# Patient Record
Sex: Male | Born: 1945 | Race: White | Hispanic: No | Marital: Married | Smoking: Never smoker
Health system: Southern US, Community
[De-identification: ages and names within clinical notes are randomized; demographics above are authoritative.]

## PROBLEM LIST (undated history)

## (undated) DIAGNOSIS — E119 Type 2 diabetes mellitus without complications: Secondary | ICD-10-CM

## (undated) DIAGNOSIS — M1990 Unspecified osteoarthritis, unspecified site: Secondary | ICD-10-CM

## (undated) DIAGNOSIS — I10 Essential (primary) hypertension: Secondary | ICD-10-CM

## (undated) DIAGNOSIS — C4490 Unspecified malignant neoplasm of skin, unspecified: Secondary | ICD-10-CM

## (undated) DIAGNOSIS — R011 Cardiac murmur, unspecified: Secondary | ICD-10-CM

## (undated) DIAGNOSIS — E785 Hyperlipidemia, unspecified: Secondary | ICD-10-CM

## (undated) DIAGNOSIS — G4730 Sleep apnea, unspecified: Secondary | ICD-10-CM

## (undated) HISTORY — DX: Unspecified osteoarthritis, unspecified site: M19.90

## (undated) HISTORY — PX: CATARACT EXTRACTION: SUR2

## (undated) HISTORY — DX: Sleep apnea, unspecified: G47.30

## (undated) HISTORY — PX: CHOLECYSTECTOMY: SHX55

## (undated) HISTORY — PX: PARS PLANA VITRECTOMY W/ REPAIR OF MACULAR HOLE: SHX2170

## (undated) HISTORY — DX: Cardiac murmur, unspecified: R01.1

## (undated) HISTORY — DX: Essential (primary) hypertension: I10

## (undated) HISTORY — DX: Type 2 diabetes mellitus without complications: E11.9

## (undated) HISTORY — PX: APPENDECTOMY: SHX54

## (undated) HISTORY — DX: Unspecified malignant neoplasm of skin, unspecified: C44.90

## (undated) HISTORY — DX: Hyperlipidemia, unspecified: E78.5

---

## 2014-05-28 DIAGNOSIS — M160 Bilateral primary osteoarthritis of hip: Secondary | ICD-10-CM | POA: Insufficient documentation

## 2014-05-28 DIAGNOSIS — I10 Essential (primary) hypertension: Secondary | ICD-10-CM | POA: Insufficient documentation

## 2014-05-28 DIAGNOSIS — E7800 Pure hypercholesterolemia, unspecified: Secondary | ICD-10-CM | POA: Insufficient documentation

## 2014-05-28 DIAGNOSIS — G4733 Obstructive sleep apnea (adult) (pediatric): Secondary | ICD-10-CM | POA: Insufficient documentation

## 2014-05-30 DIAGNOSIS — N182 Chronic kidney disease, stage 2 (mild): Secondary | ICD-10-CM

## 2014-05-30 DIAGNOSIS — E1122 Type 2 diabetes mellitus with diabetic chronic kidney disease: Secondary | ICD-10-CM | POA: Insufficient documentation

## 2014-09-30 DIAGNOSIS — Z6841 Body Mass Index (BMI) 40.0 and over, adult: Secondary | ICD-10-CM | POA: Insufficient documentation

## 2015-10-01 DIAGNOSIS — Z0000 Encounter for general adult medical examination without abnormal findings: Secondary | ICD-10-CM | POA: Insufficient documentation

## 2016-10-12 ENCOUNTER — Other Ambulatory Visit: Payer: Self-pay

## 2016-11-09 ENCOUNTER — Encounter: Payer: Self-pay | Admitting: Urology

## 2016-11-09 ENCOUNTER — Ambulatory Visit (INDEPENDENT_AMBULATORY_CARE_PROVIDER_SITE_OTHER): Payer: Medicare Other | Admitting: Urology

## 2016-11-09 DIAGNOSIS — R972 Elevated prostate specific antigen [PSA]: Secondary | ICD-10-CM | POA: Diagnosis not present

## 2016-11-09 DIAGNOSIS — N400 Enlarged prostate without lower urinary tract symptoms: Secondary | ICD-10-CM

## 2016-11-09 NOTE — Progress Notes (Signed)
11/09/2016 11:14 AM   Alvin Thompson 1945-07-25 [id number]  Referring provider: Kirk Ruths, MD [place] [facility] [place], [place] [address]  Chief Complaint  Patient presents with  . Elevated PSA    HPI: The patient presents today for evaluation of an elevated PSA.  1. Elevated PSA PSA was 4.02 in June 2018. Denies previous history of elevated PSAs. No family history of prostate cancer.  2. BPH IPSS: 9/3. Nocturia x2. On flomax. He does sometimes feel like his bladder is not completely empty. Overall he is not bothered by these symptoms.   PMH: Past Medical History:  Diagnosis Date  . Arthritis   . Diabetes mellitus without complication ([place])   . Heart murmur   . Hyperlipidemia   . Hypertension   . Skin cancer   . Sleep apnea     Surgical History: Past Surgical History:  Procedure Laterality Date  . APPENDECTOMY    . CATARACT EXTRACTION    . CHOLECYSTECTOMY    . PARS PLANA VITRECTOMY W/ REPAIR OF MACULAR HOLE      Home Medications:  Allergies as of 11/09/2016   No Known Allergies     Medication List       Accurate as of 11/09/16 11:14 AM. Always use your most recent med list.          amLODipine 10 MG tablet Commonly known as:  NORVASC TAKE ONE TABLET BY MOUTH ONCE DAILY   aspirin EC 81 MG tablet Take by mouth.   HUMALOG KWIKPEN 100 UNIT/ML KiwkPen Generic drug:  insulin lispro INJECT 15 UNITS SUBCUTANEOUSLY THREE TIMES DAILY WITH MEALS   LANTUS SOLOSTAR 100 UNIT/ML Solostar Pen Generic drug:  Insulin Glargine INJECT 50 UNITS SUBCUTANEOUSLY ONCE DAILY   lisinopril-hydrochlorothiazide 20-12.5 MG tablet Commonly known as:  PRINZIDE,ZESTORETIC Take by mouth.   metFORMIN 1000 MG tablet Commonly known as:  GLUCOPHAGE TAKE ONE TABLET BY MOUTH TWICE DAILY WITH MEALS   rosuvastatin 20 MG tablet Commonly known as:  CRESTOR Take by mouth.   tamsulosin 0.4 MG Caps capsule Commonly known as:   FLOMAX Take by mouth.       Allergies: No Known Allergies  Family History: Family History  Problem Relation Age of Onset  . Prostate cancer Neg Hx   . Bladder Cancer Neg Hx   . Kidney cancer Neg Hx     Social History:  reports that he has never smoked. He has never used smokeless tobacco. He reports that he does not drink alcohol or use drugs.  ROS: UROLOGY Frequent Urination?: Yes Hard to postpone urination?: Yes Burning/pain with urination?: No Get up at night to urinate?: Yes Leakage of urine?: Yes Urine stream starts and stops?: Yes Trouble starting stream?: No Do you have to strain to urinate?: No Blood in urine?: No Urinary tract infection?: No Sexually transmitted disease?: No Injury to kidneys or bladder?: No Painful intercourse?: No Weak stream?: No Erection problems?: Yes Penile pain?: No  Gastrointestinal Nausea?: No Vomiting?: No Indigestion/heartburn?: No Diarrhea?: Yes Constipation?: No  Constitutional Fever: No Night sweats?: No Weight loss?: No Fatigue?: No  Skin Skin rash/lesions?: No Itching?: Yes  Eyes Blurred vision?: No Double vision?: No  Ears/Nose/Throat Sore throat?: No Sinus problems?: Yes  Hematologic/Lymphatic Swollen glands?: No Easy bruising?: Yes  Cardiovascular Leg swelling?: No Chest pain?: No  Respiratory Cough?: No Shortness of breath?: No  Endocrine Excessive thirst?: No  Musculoskeletal Back pain?: Yes Joint pain?: Yes  Neurological Headaches?:  No Dizziness?: No  Psychologic Depression?: No Anxiety?: No  Physical Exam: There were no vitals taken for this visit.  Constitutional:  Alert and oriented, No acute distress. HEENT: [place] AT, moist mucus membranes.  Trachea midline, no masses. Cardiovascular: No clubbing, cyanosis, or edema. Respiratory: Normal respiratory effort, no increased work of breathing. GI: Abdomen is soft, nontender, nondistended, no abdominal masses GU: No CVA  tenderness. Normal phallus. Testicles descended equally bilaterally benign. DRE: Prostate difficult to palpate due to body habitus. No obvious abnormalities on the palpable portion of distal prostate. Skin: No rashes, bruises or suspicious lesions. Lymph: No cervical or inguinal adenopathy. Neurologic: Grossly intact, no focal deficits, moving all 4 extremities. Psychiatric: Normal mood and affect.  Laboratory Data: No results found for: WBC, HGB, HCT, MCV, PLT  No results found for: CREATININE  No results found for: PSA  No results found for: TESTOSTERONE  No results found for: HGBA1C  Urinalysis No results found for: COLORURINE, APPEARANCEUR, LABSPEC, PHURINE, GLUCOSEU, HGBUR, BILIRUBINUR, KETONESUR, PROTEINUR, UROBILINOGEN, NITRITE, LEUKOCYTESUR   Assessment & Plan:    1. Elevated PSA I had a long discussion with the patient regarding the role of PSA testing and prostate cancer screening. We also discussed the natural history of prostate cancer. We discussed that the next step is to confirm that this is a true elevation with a repeat PSA test. We also discussed with that we typically schedule prostate biopsy pending the results of this repeat PSA. He understands the risks, benefits, indications for prostate biopsy. He understands the risks include bleeding and infection. He understands that he will see blood in his urine, stool, and semen. The blood in his semen may persist for up to 6 weeks. He also understands the risk of infection of about 1% would require inpatient hospitalization for IV antibiotics. All questions were answered. The patient has elected to proceed with prostate biopsy pending the results of his repeat PSA.  2. BPH -continue flomax   No Follow-up on file.  Nickie Retort, MD  [facility] [address], [place], [place] [address] [phone]

## 2016-11-09 NOTE — Addendum Note (Signed)
Addended by: Tommy Rainwater on: 11/09/2016 11:30 AM   Modules accepted: Orders

## 2016-11-10 ENCOUNTER — Telehealth: Payer: Self-pay | Admitting: Family Medicine

## 2016-11-10 LAB — PSA: Prostate Specific Ag, Serum: 3.5 ng/mL (ref 0.0–4.0)

## 2016-11-10 NOTE — Telephone Encounter (Signed)
Left message on machine for patient to call back.

## 2016-11-10 NOTE — Telephone Encounter (Signed)
-----   Message from Nickie Retort, MD sent at 11/10/2016 11:16 AM EDT ----- Please let patient know PSA is back in normal range. Can cancel biopsy. He should f/u in 6 months with psa prior instead  ----- Message ----- From: Orlene Erm, CMA Sent: 11/10/2016   8:02 AM To: Nickie Retort, MD    ----- Message ----- From: Interface, [facility] Lab Results In Sent: 11/10/2016   5:42 AM To: [facility]

## 2016-11-11 NOTE — Telephone Encounter (Signed)
LMOM for patient to call office back. 

## 2016-11-14 NOTE — Telephone Encounter (Signed)
Pt called and I read note from Dr Pilar Jarvis.  I canceled biopsy and results.  I made 6 month follow up with PSA prior.

## 2016-12-01 ENCOUNTER — Other Ambulatory Visit: Payer: Medicare Other

## 2016-12-08 ENCOUNTER — Ambulatory Visit: Payer: Medicare Other

## 2017-05-03 ENCOUNTER — Other Ambulatory Visit: Payer: Self-pay

## 2017-05-03 DIAGNOSIS — R972 Elevated prostate specific antigen [PSA]: Secondary | ICD-10-CM

## 2017-05-04 ENCOUNTER — Other Ambulatory Visit: Payer: Medicare Other

## 2017-05-04 DIAGNOSIS — R972 Elevated prostate specific antigen [PSA]: Secondary | ICD-10-CM

## 2017-05-05 LAB — PSA: Prostate Specific Ag, Serum: 3.8 ng/mL (ref 0.0–4.0)

## 2017-05-11 ENCOUNTER — Ambulatory Visit: Payer: Medicare Other | Admitting: Urology

## 2017-05-11 ENCOUNTER — Encounter: Payer: Self-pay | Admitting: Urology

## 2017-05-11 VITALS — BP 179/70 | HR 64 | Ht 71.0 in | Wt 289.3 lb

## 2017-05-11 DIAGNOSIS — N400 Enlarged prostate without lower urinary tract symptoms: Secondary | ICD-10-CM | POA: Diagnosis not present

## 2017-05-11 DIAGNOSIS — R972 Elevated prostate specific antigen [PSA]: Secondary | ICD-10-CM

## 2017-05-11 NOTE — Progress Notes (Signed)
05/11/2017 9:20 AM   Alvin Thompson 02-22-1946 [id number]  Referring provider: Kirk Ruths, MD [facility] [facility] [place], [place] [address]  No chief complaint on file.   HPI: The patient presents today for follow up of an elevated PSA.  1. Elevated PSA PSA was 4.02 in June 2018. Denies previous history of elevated PSAs. No family history of prostate cancer.  DRE in  July 2018 was difficult due to to body habitus.  The palpable portion of the distal prostate was benign.  However, repeat PSA in July 2018 was 3.5.  PSA was 3.8 in January 2019.  2. BPH IPSS: 9/3. Nocturia x2. On flomax. He does sometimes feel like his bladder is not completely empty. Overall he is not bothered by these symptoms.        PMH: Past Medical History:  Diagnosis Date  . Arthritis   . Diabetes mellitus without complication ([place])   . Heart murmur   . Hyperlipidemia   . Hypertension   . Skin cancer   . Sleep apnea     Surgical History: Past Surgical History:  Procedure Laterality Date  . APPENDECTOMY    . CATARACT EXTRACTION    . CHOLECYSTECTOMY    . PARS PLANA VITRECTOMY W/ REPAIR OF MACULAR HOLE      Home Medications:  Allergies as of 05/11/2017   No Known Allergies     Medication List        Accurate as of 05/11/17  9:20 AM. Always use your most recent med list.          amLODipine 10 MG tablet Commonly known as:  NORVASC TAKE ONE TABLET BY MOUTH ONCE DAILY   aspirin EC 81 MG tablet Take by mouth.   HUMALOG KWIKPEN 100 UNIT/ML KiwkPen Generic drug:  insulin lispro INJECT 15 UNITS SUBCUTANEOUSLY THREE TIMES DAILY WITH MEALS   LANTUS SOLOSTAR 100 UNIT/ML Solostar Pen Generic drug:  Insulin Glargine INJECT 50 UNITS SUBCUTANEOUSLY ONCE DAILY   lisinopril-hydrochlorothiazide 20-12.5 MG tablet Commonly known as:  PRINZIDE,ZESTORETIC Take by mouth.   metFORMIN 1000 MG tablet Commonly known as:  GLUCOPHAGE TAKE ONE TABLET BY  MOUTH TWICE DAILY WITH MEALS   rosuvastatin 20 MG tablet Commonly known as:  CRESTOR Take by mouth.   tamsulosin 0.4 MG Caps capsule Commonly known as:  FLOMAX Take by mouth.       Allergies: No Known Allergies  Family History: Family History  Problem Relation Age of Onset  . Prostate cancer Neg Hx   . Bladder Cancer Neg Hx   . Kidney cancer Neg Hx     Social History:  reports that  has never smoked. he has never used smokeless tobacco. He reports that he does not drink alcohol or use drugs.  ROS: UROLOGY Frequent Urination?: No Hard to postpone urination?: No Burning/pain with urination?: No Get up at night to urinate?: Yes Leakage of urine?: No Urine stream starts and stops?: No Trouble starting stream?: No Do you have to strain to urinate?: No Blood in urine?: No Urinary tract infection?: No Sexually transmitted disease?: No Injury to kidneys or bladder?: No Painful intercourse?: No Weak stream?: No Erection problems?: Yes Penile pain?: No  Gastrointestinal Nausea?: No Vomiting?: No Indigestion/heartburn?: No Diarrhea?: No Constipation?: No  Constitutional Fever: No Night sweats?: No Weight loss?: No Fatigue?: No  Skin Skin rash/lesions?: No Itching?: No  Eyes Blurred vision?: No Double vision?: No  Ears/Nose/Throat Sore throat?: No Sinus problems?:  No  Hematologic/Lymphatic Swollen glands?: No Easy bruising?: No  Cardiovascular Leg swelling?: No Chest pain?: No  Respiratory Cough?: No Shortness of breath?: No  Endocrine Excessive thirst?: No  Musculoskeletal Back pain?: No Joint pain?: No  Neurological Headaches?: No Dizziness?: No  Psychologic Depression?: No Anxiety?: No  Physical Exam: BP (!) 179/70 (BP Location: Right Arm, Patient Position: Sitting, Cuff Size: Normal)   Pulse 64   Ht 5\' 11"  (1.803 m)   Wt 289 lb 4.8 oz (131.2 kg)   BMI 40.35 kg/m   Constitutional:  Alert and oriented, No acute  distress. HEENT: [place] AT, moist mucus membranes.  Trachea midline, no masses. Cardiovascular: No clubbing, cyanosis, or edema. Respiratory: Normal respiratory effort, no increased work of breathing. GI: Abdomen is soft, nontender, nondistended, no abdominal masses GU: No CVA tenderness.  Skin: No rashes, bruises or suspicious lesions. Lymph: No cervical or inguinal adenopathy. Neurologic: Grossly intact, no focal deficits, moving all 4 extremities. Psychiatric: Normal mood and affect.  Laboratory Data: No results found for: WBC, HGB, HCT, MCV, PLT  No results found for: CREATININE  No results found for: PSA  No results found for: TESTOSTERONE  No results found for: HGBA1C  Urinalysis No results found for: COLORURINE, APPEARANCEUR, LABSPEC, PHURINE, GLUCOSEU, HGBUR, BILIRUBINUR, KETONESUR, PROTEINUR, UROBILINOGEN, NITRITE, LEUKOCYTESUR   Assessment & Plan:    1.  History of elevated PSA PSA x2 now in normal range with history only being a very mildly elevated PSA.  He can now return to annual screening.  Follow-up in 1 year with PSA prior.  2.  BPH Continue Flomax  No Follow-up on file.  Nickie Retort, MD  [facility] [address] [place], [place] [address] [phone]

## 2017-11-17 ENCOUNTER — Ambulatory Visit: Payer: Medicare Other | Admitting: Urology

## 2017-11-17 ENCOUNTER — Encounter: Payer: Self-pay | Admitting: Urology

## 2017-11-17 VITALS — BP 137/69 | HR 70 | Ht 70.0 in | Wt 270.0 lb

## 2017-11-17 DIAGNOSIS — Z87440 Personal history of urinary (tract) infections: Secondary | ICD-10-CM

## 2017-11-17 DIAGNOSIS — R972 Elevated prostate specific antigen [PSA]: Secondary | ICD-10-CM | POA: Diagnosis not present

## 2017-11-17 DIAGNOSIS — N400 Enlarged prostate without lower urinary tract symptoms: Secondary | ICD-10-CM

## 2017-11-17 LAB — MICROSCOPIC EXAMINATION: Epithelial Cells (non renal): NONE SEEN /hpf (ref 0–10)

## 2017-11-17 LAB — URINALYSIS, COMPLETE
BILIRUBIN UA: NEGATIVE
KETONES UA: NEGATIVE
Nitrite, UA: NEGATIVE
PH UA: 5.5 (ref 5.0–7.5)
Specific Gravity, UA: >1.03 — ABNORMAL HIGH (ref 1.005–1.030)
UUROB: 0.2 mg/dL (ref 0.2–1.0)

## 2017-11-17 NOTE — Progress Notes (Signed)
11/17/2017 1:11 PM   Alvin Thompson [birth year] [id number]  Referring provider: Kirk Ruths, MD [facility] [place],  [id number]  Chief Complaint  Patient presents with  . Elevated PSA    HPI: 72 year old male with history of elevated PSA and BPH who presents today for follow-up of his issues.  Since last visit, he has actively tring to lose weight and improved control of his diabetes.    He was seen and evaluated in 10/2016 by Dr. Baruch Gouty for elevated PSA.  The point in time, his PSA was 4.02 on 09/2016.  His PSA has been fluctuating somewhat since then.  PSA trend as below.  He also has a history of BPH on Flomax.  IPSS 6/1.  He is going well.  Nocturia x 2  He was treated for a UTI 06/2017.  This was his first recent UTI and was symptomatic at the time.  He denies and dysuria or gross hematuria today.    PSA: 4.02 09/2016 3.5 10/2016 3.8 05/2017 4.34 09/2017  IPSS    Row Name 11/17/17 1100         International Prostate Symptom Score   How often have you had the sensation of not emptying your bladder?  Less than 1 in 5     How often have you had to urinate less than every two hours?  Less than 1 in 5 times     How often have you found you stopped and started again several times when you urinated?  Less than 1 in 5 times     How often have you found it difficult to postpone urination?  Less than 1 in 5 times     How often have you had a weak urinary stream?  Not at All     How often have you had to strain to start urination?  Not at All     How many times did you typically get up at night to urinate?  2 Times     Total IPSS Score  6       Quality of Life due to urinary symptoms   If you were to spend the rest of your life with your urinary condition just the way it is now how would you feel about that?  Pleased        Score:  1-7 Mild 8-19 Moderate 20-35 Severe   PMH: Past Medical History:  Diagnosis Date    . Arthritis   . Diabetes mellitus without complication ([place])   . Heart murmur   . Hyperlipidemia   . Hypertension   . Skin cancer   . Sleep apnea     Surgical History: Past Surgical History:  Procedure Laterality Date  . APPENDECTOMY    . CATARACT EXTRACTION    . CHOLECYSTECTOMY    . PARS PLANA VITRECTOMY W/ REPAIR OF MACULAR HOLE      Home Medications:  Allergies as of 11/17/2017   No Known Allergies     Medication List        Accurate as of 11/17/17 11:59 PM. Always use your most recent med list.          amLODipine 10 MG tablet Commonly known as:  NORVASC TAKE ONE TABLET BY MOUTH ONCE DAILY   aspirin EC 81 MG tablet Take by mouth.   HUMALOG KWIKPEN 100 UNIT/ML KiwkPen Generic drug:  insulin lispro INJECT 15 UNITS SUBCUTANEOUSLY THREE TIMES DAILY WITH MEALS  LANTUS SOLOSTAR 100 UNIT/ML Solostar Pen Generic drug:  Insulin Glargine INJECT 50 UNITS SUBCUTANEOUSLY ONCE DAILY   lisinopril 40 MG tablet Commonly known as:  PRINIVIL,ZESTRIL Take 40 mg by mouth daily.   lisinopril-hydrochlorothiazide 20-12.5 MG tablet Commonly known as:  PRINZIDE,ZESTORETIC Take by mouth.   metFORMIN 1000 MG tablet Commonly known as:  GLUCOPHAGE TAKE ONE TABLET BY MOUTH TWICE DAILY WITH MEALS   rosuvastatin 20 MG tablet Commonly known as:  CRESTOR Take by mouth.   tamsulosin 0.4 MG Caps capsule Commonly known as:  FLOMAX Take by mouth.       Allergies: No Known Allergies  Family History: Family History  Problem Relation Age of Onset  . Prostate cancer Neg Hx   . Bladder Cancer Neg Hx   . Kidney cancer Neg Hx     Social History:  reports that he has never smoked. He has never used smokeless tobacco. He reports that he does not drink alcohol or use drugs.  ROS: UROLOGY Frequent Urination?: No Hard to postpone urination?: Yes Burning/pain with urination?: No Get up at night to urinate?: Yes Leakage of urine?: No Urine stream starts and stops?: No Trouble  starting stream?: No Do you have to strain to urinate?: No Blood in urine?: No Urinary tract infection?: No Sexually transmitted disease?: No Injury to kidneys or bladder?: No Painful intercourse?: No Weak stream?: No Erection problems?: Yes Penile pain?: No  Gastrointestinal Nausea?: No Vomiting?: No Indigestion/heartburn?: No Diarrhea?: No Constipation?: No  Constitutional Fever: No Night sweats?: No Weight loss?: No Fatigue?: No  Skin Skin rash/lesions?: No Itching?: No  Eyes Blurred vision?: No Double vision?: No  Ears/Nose/Throat Sore throat?: No Sinus problems?: No  Hematologic/Lymphatic Swollen glands?: No Easy bruising?: Yes  Cardiovascular Leg swelling?: No Chest pain?: No  Respiratory Cough?: No Shortness of breath?: No  Endocrine Excessive thirst?: No  Musculoskeletal Back pain?: No Joint pain?: No  Neurological Headaches?: No Dizziness?: No  Psychologic Depression?: No Anxiety?: No  Physical Exam: BP 137/69   Pulse 70   Ht 5\' 10"  (1.778 m)   Wt 270 lb (122.5 kg)   BMI 38.74 kg/m   Constitutional:  Alert and oriented, No acute distress. HEENT: [place] AT, moist mucus membranes.  Trachea midline, no masses. Cardiovascular: No clubbing, cyanosis, or edema. Respiratory: Normal respiratory effort, no increased work of breathing. GI: Abdomen is soft, nontender, nondistended, no abdominal masses, obese Rectal: Normal sphincter tone.  40+ cc prostate, nontender, no nodules, exam somewhat limited due to habitus, able to palpate apex and mid gland but not base. Skin: No rashes, bruises or suspicious lesions. Neurologic: Grossly intact, no focal deficits, moving all 4 extremities. Psychiatric: Normal mood and affect.  Laboratory Data: PSA trend as above  Urinalysis Results for orders placed or performed in visit on 11/17/17  Microscopic Examination  Result Value Ref Range   WBC, UA 0-5 0 - 5 /hpf   RBC, UA 0-2 0 - 2 /hpf    Epithelial Cells (non renal) None seen 0 - 10 /hpf   Casts Present (A) None seen /lpf   Cast Type Hyaline casts N/A   Mucus, UA Present (A) Not Estab.   Bacteria, UA Moderate (A) None seen/Few  Urinalysis, Complete  Result Value Ref Range   Specific Gravity, UA >1.030 (H) 1.005 - 1.030   pH, UA 5.5 5.0 - 7.5   Color, UA Yellow Yellow   Appearance Ur Clear Clear   Leukocytes, UA Trace (A) Negative   Protein, UA  3+ (A) Negative/Trace   Glucose, UA Trace (A) Negative   Ketones, UA Negative Negative   RBC, UA Trace (A) Negative   Bilirubin, UA Negative Negative   Urobilinogen, Ur 0.2 0.2 - 1.0 mg/dL   Nitrite, UA Negative Negative   Microscopic Examination See below:     Pertinent Imaging: N/a  Assessment & Plan:    1. Elevated PSA History of elevated/fluctuating PSA Most recent PSA within the range of his previous values Rectal exam today reassuring We will plan to continue to monitor relatively closely, repeat PSA in 6 months and then again in a year We discussed the variability between assays, using [facility] and then alternating with [facility] I would prefer him to have all of his PSAs done in one location variance and fluctuation He is agreeable this plan and have his PSA is drawn in our office - Urinalysis, Complete - PSA; Future  2. Benign prostatic hyperplasia without lower urinary tract symptoms Symptoms well controlled on Flomax Continue this medication Not interested in any surgical intervention  3. History of UTI Isolated urinary tract infection earlier this year UA today is not suspicious We discussed that it is unusual for men to have infections, at minimum as well as bladder scan to ensure that he is emptying his bladder He is agreeable and will let us know if he has any more UTIs or UTI symptoms    Return in about 1 year (around 11/18/2018), or MD 1 year IPSS/ PVR/ PSA/ DRE, lab only in 6 months for PSA.  Hollice Espy, MD  [facility] [address] [place], [place] [address] [phone]  I spent 25 min with this patient of which greater than 50% was spent in counseling and coordination of care with the patient.

## 2018-05-21 ENCOUNTER — Other Ambulatory Visit: Payer: Medicare Other

## 2018-05-21 DIAGNOSIS — R972 Elevated prostate specific antigen [PSA]: Secondary | ICD-10-CM

## 2018-05-22 LAB — PSA: Prostate Specific Ag, Serum: 4 ng/mL (ref 0.0–4.0)

## 2018-11-13 ENCOUNTER — Other Ambulatory Visit: Payer: Self-pay

## 2018-11-13 DIAGNOSIS — R972 Elevated prostate specific antigen [PSA]: Secondary | ICD-10-CM

## 2018-11-15 ENCOUNTER — Other Ambulatory Visit: Payer: Self-pay

## 2018-11-15 ENCOUNTER — Other Ambulatory Visit: Payer: Medicare Other

## 2018-11-15 DIAGNOSIS — R972 Elevated prostate specific antigen [PSA]: Secondary | ICD-10-CM

## 2018-11-16 LAB — PSA: Prostate Specific Ag, Serum: 4.8 ng/mL — ABNORMAL HIGH (ref 0.0–4.0)

## 2018-11-20 ENCOUNTER — Encounter: Payer: Self-pay | Admitting: Urology

## 2018-11-20 ENCOUNTER — Other Ambulatory Visit: Payer: Self-pay

## 2018-11-20 ENCOUNTER — Ambulatory Visit: Payer: Medicare Other | Admitting: Urology

## 2018-11-20 VITALS — BP 172/73 | HR 71 | Ht 70.0 in | Wt 285.6 lb

## 2018-11-20 DIAGNOSIS — N401 Enlarged prostate with lower urinary tract symptoms: Secondary | ICD-10-CM

## 2018-11-20 DIAGNOSIS — R972 Elevated prostate specific antigen [PSA]: Secondary | ICD-10-CM | POA: Diagnosis not present

## 2018-11-20 DIAGNOSIS — R351 Nocturia: Secondary | ICD-10-CM | POA: Diagnosis not present

## 2018-11-20 DIAGNOSIS — Z87440 Personal history of urinary (tract) infections: Secondary | ICD-10-CM

## 2018-11-20 LAB — BLADDER SCAN AMB NON-IMAGING: Scan Result: 0

## 2018-11-20 NOTE — Progress Notes (Signed)
11/20/2018 1:51 PM   Alvin Thompson [birth year] [id number]  Referring provider: Kirk Ruths, MD [facility] [name],  [place] [address]  Chief Complaint  Patient presents with  . Elevated PSA    HPI: 73 year old male with a personal history of elevated PSA/BPH who returns today for routine annual follow-up.  He was last seen 10/2017.  PSA trend as below.  He has been  for history of elevated PSA since 2018.  His most recent PSA was up to 4.8 on 10/2018.  He does have a history of BPH.  He continues on Flomax.  His IPSS is higher today than previously, 11/2, previously 6/1.  He reports that he is having some post void dribbling and gets a few times at night to void.    He does feel that he empties his bladder for the most part.   He is reasonably satisfied with his urinary symptoms.  No dysuria or gross hematuria.  No UTIs over the past year.  Remote isolated UTI greater than a year ago.  PSA: 4.02 09/2016 3.5 10/2016 3.8 05/2017 4.34 09/2017 4.8 on 10/2018   IPSS    Row Name 11/20/18 1300         International Prostate Symptom Score   How often have you had the sensation of not emptying your bladder?  Not at All     How often have you had to urinate less than every two hours?  Less than half the time     How often have you found you stopped and started again several times when you urinated?  More than half the time     How often have you found it difficult to postpone urination?  Less than half the time     How often have you had a weak urinary stream?  Not at All     How often have you had to strain to start urination?  Not at All     How many times did you typically get up at night to urinate?  3 Times     Total IPSS Score  11       Quality of Life due to urinary symptoms   If you were to spend the rest of your life with your urinary condition just the way it is now how would you feel about that?  Mostly Satisfied         Score:  1-7 Mild 8-19 Moderate 20-35 Severe   PMH: Past Medical History:  Diagnosis Date  . Arthritis   . Diabetes mellitus without complication ([place])   . Heart murmur   . Hyperlipidemia   . Hypertension   . Skin cancer   . Sleep apnea     Surgical History: Past Surgical History:  Procedure Laterality Date  . APPENDECTOMY    . CATARACT EXTRACTION    . CHOLECYSTECTOMY    . PARS PLANA VITRECTOMY W/ REPAIR OF MACULAR HOLE      Home Medications:  Allergies as of 11/20/2018   No Known Allergies     Medication List       Accurate as of November 20, 2018  1:51 PM. If you have any questions, ask your nurse or doctor.        amLODipine 10 MG tablet Commonly known as: NORVASC TAKE ONE TABLET BY MOUTH ONCE DAILY   aspirin EC 81 MG tablet Take by mouth.   HumaLOG KwikPen 100 UNIT/ML KiwkPen Generic  drug: insulin lispro INJECT 15 UNITS SUBCUTANEOUSLY THREE TIMES DAILY WITH MEALS   Lantus SoloStar 100 UNIT/ML Solostar Pen Generic drug: Insulin Glargine INJECT 50 UNITS SUBCUTANEOUSLY ONCE DAILY   lisinopril 40 MG tablet Commonly known as: ZESTRIL Take 40 mg by mouth daily.   lisinopril-hydrochlorothiazide 20-12.5 MG tablet Commonly known as: ZESTORETIC Take by mouth.   metFORMIN 1000 MG tablet Commonly known as: GLUCOPHAGE TAKE ONE TABLET BY MOUTH TWICE DAILY WITH MEALS   rosuvastatin 20 MG tablet Commonly known as: CRESTOR Take by mouth.   tamsulosin 0.4 MG Caps capsule Commonly known as: FLOMAX Take by mouth.       Allergies: No Known Allergies  Family History: Family History  Problem Relation Age of Onset  . Prostate cancer Neg Hx   . Bladder Cancer Neg Hx   . Kidney cancer Neg Hx     Social History:  reports that he has never smoked. He has never used smokeless tobacco. He reports that he does not drink alcohol or use drugs.  ROS: UROLOGY Frequent Urination?: No Hard to postpone urination?: Yes Burning/pain with urination?: No Get up at  night to urinate?: Yes Leakage of urine?: Yes Urine stream starts and stops?: No Trouble starting stream?: No Do you have to strain to urinate?: No Blood in urine?: No Urinary tract infection?: No Sexually transmitted disease?: No Injury to kidneys or bladder?: No Painful intercourse?: No Weak stream?: No Erection problems?: Yes Penile pain?: No  Gastrointestinal Nausea?: No Vomiting?: No Indigestion/heartburn?: No Diarrhea?: No Constipation?: No  Constitutional Fever: No Night sweats?: No Weight loss?: No Fatigue?: No  Skin Skin rash/lesions?: No Itching?: No  Eyes Blurred vision?: No Double vision?: No  Ears/Nose/Throat Sore throat?: No Sinus problems?: No  Hematologic/Lymphatic Swollen glands?: No Easy bruising?: Yes  Cardiovascular Leg swelling?: No Chest pain?: No  Respiratory Cough?: No Shortness of breath?: No  Endocrine Excessive thirst?: No  Musculoskeletal Back pain?: No Joint pain?: Yes  Neurological Headaches?: No Dizziness?: No  Psychologic Depression?: No Anxiety?: No  Physical Exam: BP (!) 172/73 (BP Location: Left Arm, Patient Position: Sitting, Cuff Size: Normal)   Pulse 71   Ht 5\' 10"  (1.778 m)   Wt 285 lb 9.6 oz (129.5 kg)   BMI 40.98 kg/m   Constitutional:  Alert and oriented, No acute distress. HEENT: [place] AT, moist mucus membranes.  Trachea midline, no masses. Cardiovascular: No clubbing, cyanosis, or edema. Respiratory: Normal respiratory effort, no increased work of breathing. GI: Abdomen is soft, nontender, nondistended, obese Rectal: Normal sphincter tone.  Exam limited somewhat by habitus, able to palpate the apex which is unremarkable.  Internal hemorrhoids palpable. Skin: No rashes, bruises or suspicious lesions. Neurologic: Grossly intact, no focal deficits, moving all 4 extremities. Psychiatric: Normal mood and affect.  Laboratory Data: PSA trend as above   Pertinent Imaging: Results for orders placed  or performed in visit on 11/20/18  Bladder Scan (Post Void Residual) in office  Result Value Ref Range   Scan Result 0     Assessment & Plan:    1. Benign prostatic hyperplasia with nocturia Adequate bladder emptying today Continue Flomax Discussed alternatives including the addition of finasteride versus consideration of outlet procedure, not interested in either of these interventions - Bladder Scan (Post Void Residual) in office  2. Elevated PSA History of elevated/rising PSA albeit slow rise with some fluctuation Given his age and comorbidities, continued surveillance is quite reasonable especially given that his PSA is only risen 0.5 over the past  year Alternatives including prostate MRI and prostate biopsy were also discussed along with risks and benefits of each He would prefer to continue annual surveillance which is reasonable Follow-up next year with PSA/rectal exam  3. History of UTI No UTIs this year We will continue to monitor  Return in about 1 year (around 11/20/2019) for PSA/ DRE, IPSS/ PVR/ UA.  Hollice Espy, MD  [facility] [address] [place],  [address] [phone]

## 2019-06-16 ENCOUNTER — Ambulatory Visit: Payer: Medicare Other | Attending: Internal Medicine

## 2019-06-16 ENCOUNTER — Ambulatory Visit: Payer: Self-pay

## 2019-06-16 DIAGNOSIS — Z23 Encounter for immunization: Secondary | ICD-10-CM | POA: Insufficient documentation

## 2019-06-16 NOTE — Progress Notes (Signed)
   Covid-19 Vaccination Clinic  Name:  Alvin Thompson    MRN: [place]:[id number] DOB: [birth year]  06/16/2019  Alvin Thompson was observed post Covid-19 immunization for 15 minutes without incidence. He was provided with Vaccine Information Sheet and instruction to access the V-Safe system.   Alvin Thompson was instructed to call 911 with any severe reactions post vaccine: Marland Kitchen Difficulty breathing  . Swelling of your face and throat  . A fast heartbeat  . A bad rash all over your body  . Dizziness and weakness    Immunizations Administered    Name Date Dose VIS Date Route   Pfizer COVID-19 Vaccine 06/16/2019  8:24 AM 0.3 mL 04/12/2019 Intramuscular   Manufacturer: [place]   Lot: [id number]   [place]: [id number]

## 2019-06-28 ENCOUNTER — Other Ambulatory Visit: Payer: Self-pay | Admitting: Internal Medicine

## 2019-06-28 DIAGNOSIS — Z794 Long term (current) use of insulin: Secondary | ICD-10-CM

## 2019-07-05 ENCOUNTER — Encounter: Payer: Self-pay | Admitting: Physician Assistant

## 2019-07-05 ENCOUNTER — Other Ambulatory Visit: Payer: Self-pay

## 2019-07-05 ENCOUNTER — Ambulatory Visit: Payer: Medicare Other | Admitting: Physician Assistant

## 2019-07-05 VITALS — BP 158/69 | HR 75 | Ht 70.0 in | Wt 294.0 lb

## 2019-07-05 DIAGNOSIS — R7989 Other specified abnormal findings of blood chemistry: Secondary | ICD-10-CM | POA: Diagnosis not present

## 2019-07-05 DIAGNOSIS — R3121 Asymptomatic microscopic hematuria: Secondary | ICD-10-CM

## 2019-07-05 LAB — URINALYSIS, COMPLETE
Bilirubin, UA: NEGATIVE
Ketones, UA: NEGATIVE
Leukocytes,UA: NEGATIVE
Nitrite, UA: NEGATIVE
Specific Gravity, UA: >1.03 — ABNORMAL HIGH (ref 1.005–1.030)
Urobilinogen, Ur: 0.2 mg/dL (ref 0.2–1.0)
pH, UA: 5.5 (ref 5.0–7.5)

## 2019-07-05 LAB — MICROSCOPIC EXAMINATION: Bacteria, UA: NONE SEEN

## 2019-07-05 LAB — BLADDER SCAN AMB NON-IMAGING: Scan Result: 18

## 2019-07-05 NOTE — Progress Notes (Signed)
07/05/2019 9:11 AM   Raynaldo Urban Gibson 03/03/1946 [id number]  CC: Microscopic hematuria  HPI: Alvin Thompson is a 74 y.o. male who presents today for evaluation of microscopic hematuria.  He is an established [facility] patient who most recently saw Dr. Erlene Quan on 11/20/2018 for routine annual follow-up of elevated PSA and BPH on Flomax.  He saw his PCP, Dr. Ouida Sills, last week for routine follow-up. He was noted to have microscopic hematuria with 4-10 RBCs/hpf on urine microscopy. Recent labs also notable for increased creatinine of 1.7, with progressive upward trend since 2018 and recent baseline of 1.3. He was referred to our clinic for further evaluation.  No abdominal cross-sectional imaging available. He has never undergone cystoscopy.  He denies dysuria, urgency, frequency, and gross hematuria.  He does report an occasional twinge of discomfort on his left side over the past 5 to 6 months.  He reports a history of nephrolithiasis approximately 8 years ago.  He passed the stone on his own.  He is not on anticoagulation.  He denies a personal or family history of bladder or renal cancers.  He is a never smoker.  He denies history of exposure to industrial dye.  In-office UA today positive for trace glucose, 2+ blood, and 3+ protein; urine microscopy with granular casts. PVR 9m.  PMH: Past Medical History:  Diagnosis Date  . Arthritis   . Diabetes mellitus without complication ([place]   . Heart murmur   . Hyperlipidemia   . Hypertension   . Skin cancer   . Sleep apnea     Surgical History: Past Surgical History:  Procedure Laterality Date  . APPENDECTOMY    . CATARACT EXTRACTION    . CHOLECYSTECTOMY    . PARS PLANA VITRECTOMY W/ REPAIR OF MACULAR HOLE      Home Medications:  Allergies as of 07/05/2019   No Known Allergies     Medication List       Accurate as of July 05, 2019  9:11 AM. If you have any questions, ask your nurse or doctor.        amLODipine 10  MG tablet Commonly known as: NORVASC TAKE ONE TABLET BY MOUTH ONCE DAILY   aspirin EC 81 MG tablet Take by mouth.   HumaLOG KwikPen 100 UNIT/ML KiwkPen Generic drug: insulin lispro INJECT 15 UNITS SUBCUTANEOUSLY THREE TIMES DAILY WITH MEALS   Lantus SoloStar 100 UNIT/ML Solostar Pen Generic drug: insulin glargine INJECT 50 UNITS SUBCUTANEOUSLY ONCE DAILY   lisinopril 40 MG tablet Commonly known as: ZESTRIL Take 40 mg by mouth daily.   lisinopril-hydrochlorothiazide 20-12.5 MG tablet Commonly known as: ZESTORETIC Take by mouth.   metFORMIN 1000 MG tablet Commonly known as: GLUCOPHAGE TAKE ONE TABLET BY MOUTH TWICE DAILY WITH MEALS   rosuvastatin 20 MG tablet Commonly known as: CRESTOR Take by mouth.   tamsulosin 0.4 MG Caps capsule Commonly known as: FLOMAX Take by mouth.      Allergies:  No Known Allergies  Family History: Family History  Problem Relation Age of Onset  . Prostate cancer Neg Hx   . Bladder Cancer Neg Hx   . Kidney cancer Neg Hx     Social History:   reports that he has never smoked. He has never used smokeless tobacco. He reports that he does not drink alcohol or use drugs.  Physical Exam: There were no vitals taken for this visit.  Constitutional:  Alert and oriented, no acute distress, nontoxic appearing HEENT: [place], AT Cardiovascular: No  clubbing, cyanosis, or edema Respiratory: Normal respiratory effort, no increased work of breathing Skin: No rashes, bruises or suspicious lesions Neurologic: Grossly intact, no focal deficits, moving all 4 extremities Psychiatric: Normal mood and affect  Laboratory Data: Results for orders placed or performed in visit on 07/05/19  Microscopic Examination   URINE  Result Value Ref Range   WBC, UA 0-5 0 - 5 /hpf   RBC 0-2 0 - 2 /hpf   Epithelial Cells (non renal) 0-10 0 - 10 /hpf   Casts Present (A) None seen /lpf   Bacteria, UA None seen None seen/Few  Urinalysis, Complete  Result Value Ref  Range   Specific Gravity, UA >1.030 (H) 1.005 - 1.030   pH, UA 5.5 5.0 - 7.5   Color, UA Yellow Yellow   Appearance Ur Clear Clear   Leukocytes,UA Negative Negative   Protein,UA 3+ (A) Negative/Trace   Glucose, UA Trace (A) Negative   Ketones, UA Negative Negative   RBC, UA 2+ (A) Negative   Bilirubin, UA Negative Negative   Urobilinogen, Ur 0.2 0.2 - 1.0 mg/dL   Nitrite, UA Negative Negative   Microscopic Examination See below:   Bladder Scan (Post Void Residual) in office  Result Value Ref Range   Scan Result 18    Assessment & Plan:   1. Asymptomatic microscopic hematuria 74 year old male with PMH BPH on Flomax, elevated PSA, and remote isolated nephrolithiasis presents with microscopic hematuria.  He has also had some vague left side pain, unclear if this is associated.  UA today notable for granular casts without red cells.  I had a lengthy conversation with the patient today regarding possible causes of hematuria, including stones, cysts, anticoagulation, BPH, and neoplasm of the urinary tract.  I explained that it is important to obtain imaging to evaluate for these and that this work-up includes cross-sectional imaging as well as a cystoscopy as complementary test to fully evaluate the upper and lower urinary tract.  I counseled him that I recommend CT hematuria with follow-up in our clinic in 1 month to discuss results and undergo cystoscopy.  He expressed understanding. - Urinalysis, Complete - CT HEMATURIA WORKUP  2. Elevated serum creatinine PVR reassuring for postrenal etiology.  Will repeat creatinine today.  EGFR on 06/21/2019 was >30; will proceed with plans for CT urogram unless repeat creatinine today represents significant worsening.  If EGFR comes back <30, will order CT AP without contrast instead. - Bladder Scan (Post Void Residual) in office - Creatinine   Return in about 4 weeks (around 08/02/2019) for Cysto and CTU results.  Debroah Loop,  PA-C  [facility] [address], [place], [place] [address] [phone]

## 2019-07-06 LAB — CREATININE, SERUM
Creatinine, Ser: 1.71 mg/dL — ABNORMAL HIGH (ref 0.76–1.27)
GFR calc Af Amer: 45 mL/min/{1.73_m2} — ABNORMAL LOW (ref >59)
GFR calc non Af Amer: 39 mL/min/{1.73_m2} — ABNORMAL LOW (ref >59)

## 2019-07-10 ENCOUNTER — Ambulatory Visit: Payer: Medicare Other | Attending: Internal Medicine

## 2019-07-10 DIAGNOSIS — Z23 Encounter for immunization: Secondary | ICD-10-CM | POA: Insufficient documentation

## 2019-07-10 NOTE — Progress Notes (Signed)
   Covid-19 Vaccination Clinic  Name:  Aedin Saucer    MRN: [place]:[id number] DOB: 1946/01/08  07/10/2019  Mr. Newsham was observed post Covid-19 immunization for 15 minutes without incident. He was provided with Vaccine Information Sheet and instruction to access the V-Safe system.   Mr. Risinger was instructed to call 911 with any severe reactions post vaccine: Marland Kitchen Difficulty breathing  . Swelling of face and throat  . A fast heartbeat  . A bad rash all over body  . Dizziness and weakness   Immunizations Administered    Name Date Dose VIS Date Route   Pfizer COVID-19 Vaccine 07/10/2019 12:37 PM 0.3 mL 04/12/2019 Intramuscular   Manufacturer: [place]   Lot: [id number]   [place]: [id number]

## 2019-07-11 ENCOUNTER — Other Ambulatory Visit: Payer: Self-pay

## 2019-07-11 ENCOUNTER — Ambulatory Visit
Admission: RE | Admit: 2019-07-11 | Discharge: 2019-07-11 | Disposition: A | Discharge status: Home / Self Care | Payer: Medicare Other | Source: Ambulatory Visit | Attending: Internal Medicine | Admitting: Internal Medicine

## 2019-07-11 DIAGNOSIS — N1831 Chronic kidney disease, stage 3a: Secondary | ICD-10-CM | POA: Insufficient documentation

## 2019-07-11 DIAGNOSIS — E1121 Type 2 diabetes mellitus with diabetic nephropathy: Secondary | ICD-10-CM | POA: Insufficient documentation

## 2019-07-11 DIAGNOSIS — Z794 Long term (current) use of insulin: Secondary | ICD-10-CM | POA: Diagnosis present

## 2019-08-09 ENCOUNTER — Other Ambulatory Visit: Payer: Self-pay

## 2019-08-09 ENCOUNTER — Ambulatory Visit
Admission: RE | Admit: 2019-08-09 | Discharge: 2019-08-09 | Disposition: A | Discharge status: Home / Self Care | Payer: Medicare Other | Source: Ambulatory Visit | Attending: Physician Assistant | Admitting: Physician Assistant

## 2019-08-09 DIAGNOSIS — R3121 Asymptomatic microscopic hematuria: Secondary | ICD-10-CM | POA: Diagnosis present

## 2019-08-09 LAB — POCT I-STAT CREATININE: Creatinine, Ser: 1.7 mg/dL — ABNORMAL HIGH (ref 0.61–1.24)

## 2019-08-09 MED ORDER — IOHEXOL 300 MG/ML  SOLN
100.0000 mL | Freq: Once | INTRAMUSCULAR | Status: AC | PRN
  Administered 2019-08-09: 100 mL via INTRAVENOUS

## 2019-08-13 ENCOUNTER — Other Ambulatory Visit: Payer: Self-pay | Admitting: Urology

## 2019-08-21 ENCOUNTER — Ambulatory Visit: Payer: Medicare Other | Admitting: Urology

## 2019-08-21 ENCOUNTER — Other Ambulatory Visit: Payer: Self-pay

## 2019-08-21 ENCOUNTER — Encounter: Payer: Self-pay | Admitting: Urology

## 2019-08-21 VITALS — BP 175/69 | HR 80 | Ht 70.0 in | Wt 294.0 lb

## 2019-08-21 DIAGNOSIS — R3121 Asymptomatic microscopic hematuria: Secondary | ICD-10-CM | POA: Diagnosis not present

## 2019-08-21 DIAGNOSIS — N471 Phimosis: Secondary | ICD-10-CM

## 2019-08-21 MED ORDER — TRIAMCINOLONE ACETONIDE 0.025 % EX CREA: 1.0000 "application " | TOPICAL_CREAM | Freq: Two times a day (BID) | CUTANEOUS | 0 refills | Status: DC

## 2019-08-21 MED ORDER — NYSTATIN 100000 UNIT/GM EX CREA: 1.0000 "application " | TOPICAL_CREAM | Freq: Two times a day (BID) | CUTANEOUS | 0 refills | Status: DC

## 2019-08-21 NOTE — Progress Notes (Signed)
08/21/19  CC:  Chief Complaint  Patient presents with  . Cysto    HPI: Alvin Thompson is a 74 y.o. M who returns today for a cysto for the evaluation of microscopic hematuria.  He was noted to have microscopic hematuria with 4-10 RBCs/hpf on urine microscopy on his visit w/ PCP. Recent labs also notable for increased creatinine of 1.7, with progressive upward trend since 2018 and recent baseline of 1.3. He was referred to our clinic for further evaluation.  In-office UA today positive for trace glucose, 2+ blood, and 3+ protein; urine microscopy with granular casts. PVR 15mL.  CT urogram on 08/09/19 revealed enlarged prostate and possible associated mild bladder wall thickening. Intrarenal collecting systems, ureters and bladder are otherwise poorly opacified, limiting further evaluation.  He reports of difficulties w/ pulling his foreskin back sometimes when urinating. He did it partially today prior to giving urine specimen. His father had circumcision performed at advanced age for issues..   Denies any bothersome urinary symptoms   Creatinine 1.70 as of 08/09/19.   Vitals:   08/21/19 1109  BP: (!) 175/69  Pulse: 80   NED. A&Ox3.   No respiratory distress   Abd soft, NT, ND Normal phallus with bilateral descended testicles  Cystoscopy Procedure Note  Patient identification was confirmed, informed consent was obtained, and patient was prepped using Betadine solution.  Lidocaine jelly was administered per urethral meatus.     Pre-Procedure: - Inspection reveals a phimosis, unable to place scope under direct vision sensation, had to be done using tactile feedback  Procedure: The flexible cystoscope was introduced without difficulty - No urethral strictures/lesions are present. - Enlarged prostate w/ no median lobe  - Bilopar coaptation and little blood upon manipulation w/ instrumentation - Normal bladder neck - Bilateral ureteral orifices identified - Bladder  mucosa  reveals no ulcers, tumors, or lesions - No bladder stones - Moderate trabeculation w/ thickening fibers   Retroflexion unremarkable.    Post-Procedure: - Patient tolerated the procedure well  Pertinent Imagings: CLINICAL DATA:  Microscopic hematuria for 1 month.  EXAM: CT ABDOMEN AND PELVIS WITHOUT AND WITH CONTRAST  TECHNIQUE: Multidetector CT imaging of the abdomen and pelvis was performed following the standard protocol before and following the bolus administration of intravenous contrast.  CONTRAST:  180mL OMNIPAQUE IOHEXOL 300 MG/ML  SOLN  COMPARISON:  Renal ultrasound 07/11/2019.  FINDINGS: Lower chest: Probable tiny subpleural lymph node along the right hemidiaphragm (9/12). Lung bases are otherwise clear. Heart is enlarged. Atherosclerotic calcification of the aorta, aortic valve and coronary arteries. No pericardial or effusion. Distal esophagus is unremarkable.  Hepatobiliary: Liver is unremarkable. Cholecystectomy. No biliary ductal dilatation.  Pancreas: Negative.  Spleen: Negative.  Adrenals/Urinary Tract: Adrenal glands are unremarkable. 2.3 cm cyst in the upper pole right kidney. No urinary stones. Ureters are decompressed. There is relatively poor opacification of the intrarenal collecting systems and ureters bilaterally, limiting evaluation. There may be minimal bladder wall thickening. Otherwise, the opacified portion bladder is unremarkable.  Stomach/Bowel: Stomach, small bowel and colon are unremarkable. Appendectomy.  Vascular/Lymphatic: Atherosclerotic calcification of the aorta without aneurysm. No pathologically enlarged lymph nodes.  Reproductive: Prostate is enlarged.  Other: No free fluid.  Mesenteries and peritoneum are unremarkable.  Musculoskeletal: Degenerative changes in the spine. No worrisome lytic or sclerotic lesions.  IMPRESSION: 1. Enlarged prostate. Possible associated mild bladder wall  thickening. 2. Intrarenal collecting systems, ureters and bladder are otherwise poorly opacified, limiting further evaluation. 3. Aortic atherosclerosis (ICD10-I70.0). Coronary artery  calcification.   Electronically Signed   By: Lorin Picket M.D.   On: 08/09/2019 15:27  I have personally reviewed the images and agree with radiologist interpretation.   Assessment/ Plan:  1. Asymptomatic microscopic hematuria  UA today revealed microscopic blood  CT on 08/09/19 revealed enlarged prostate and possible associated mild bladder wall thickening Minimal bother from urinary symptoms NED on cysto  Return in 1 year for IPSS/UA/PSA/symptom recheck with PA-C  2. Phimosis  Encouraged to loosen foreskin  Rx of antifungal cream sent to pharamcy Consider circumcision in the future This may be the source of his microscopic blood  Return in about 1 year (around 08/20/2020) for IPSS, PSA, UA with PA.  I, Lucas Mallow, am acting as a scribe for Dr. Hollice Espy,  I have reviewed the above documentation for accuracy and completeness, and I agree with the above.   Hollice Espy, MD

## 2019-08-22 LAB — URINALYSIS, COMPLETE
Bilirubin, UA: NEGATIVE
Leukocytes,UA: NEGATIVE
Nitrite, UA: NEGATIVE
Specific Gravity, UA: 1.025 (ref 1.005–1.030)
Urobilinogen, Ur: 0.2 mg/dL (ref 0.2–1.0)
pH, UA: 5.5 (ref 5.0–7.5)

## 2019-08-22 LAB — MICROSCOPIC EXAMINATION: Bacteria, UA: NONE SEEN

## 2019-11-22 ENCOUNTER — Other Ambulatory Visit: Payer: Medicare Other

## 2019-11-26 ENCOUNTER — Ambulatory Visit: Payer: Medicare Other | Admitting: Urology

## 2020-08-05 ENCOUNTER — Other Ambulatory Visit: Payer: Self-pay | Admitting: Internal Medicine

## 2020-08-05 ENCOUNTER — Other Ambulatory Visit: Payer: Self-pay | Admitting: Nephrology

## 2020-08-05 DIAGNOSIS — R809 Proteinuria, unspecified: Secondary | ICD-10-CM

## 2020-08-12 IMAGING — CT CT ABD-PEL WO/W CM
3 of 12 series · 12 of 46 positions shown, 18 images · IV contrast (omnipaque)
Comparison: Renal ultrasound 07/11/2019.

CLINICAL DATA: Microscopic hematuria for 1 month.

EXAM:
CT ABDOMEN AND PELVIS WITHOUT AND WITH CONTRAST
TECHNIQUE: Multidetector CT imaging of the abdomen and pelvis was performed
following the standard protocol before and following the bolus
administration of intravenous contrast.
CONTRAST:  100mL OMNIPAQUE IOHEXOL 300 MG/ML  SOLN

[Series 4: axial post · axial · 0.90mm/px · z∈[-482,-77]mm · 6 of 115 slices shown, 11 images]
[im 17/115  soft-tissue]
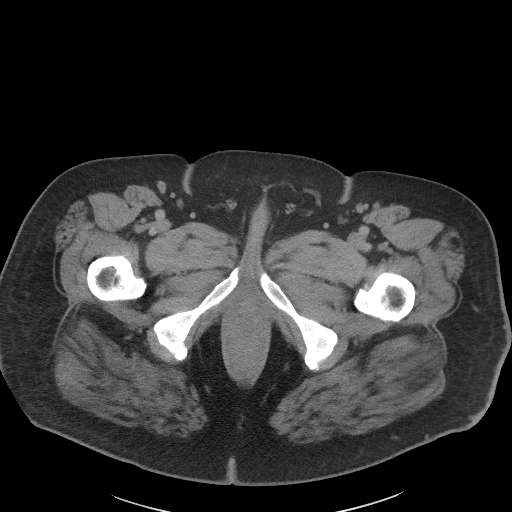
[im 17/115  bone]
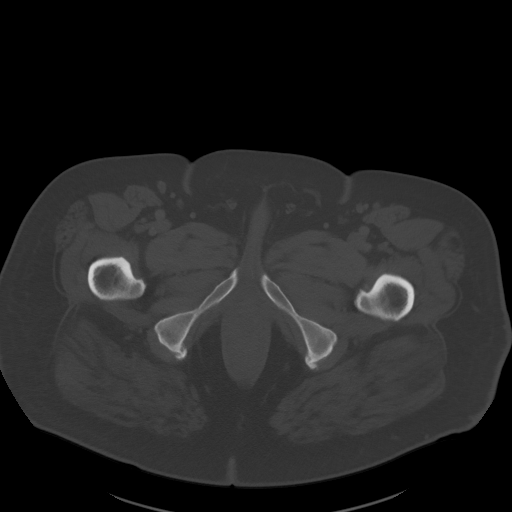
[im 33/115  soft-tissue]
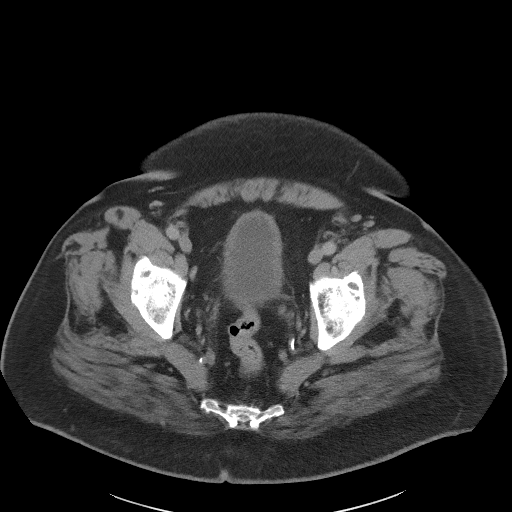
[im 49/115  soft-tissue]
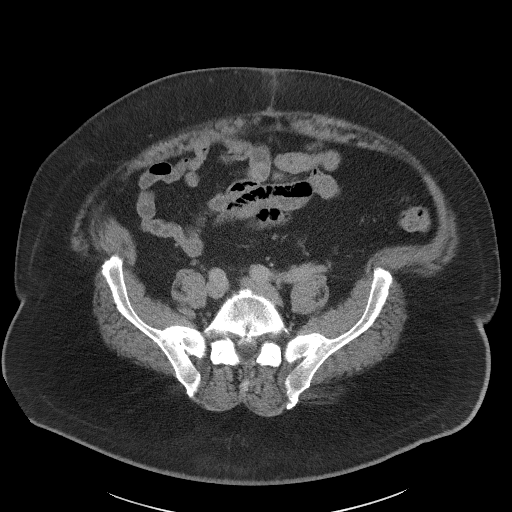
[im 49/115  lung]
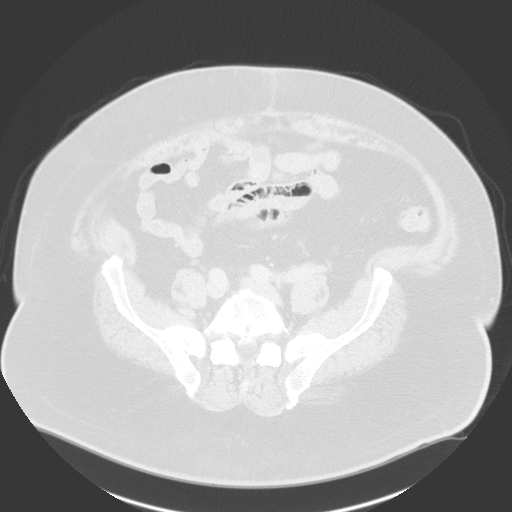
[im 66/115  soft-tissue]
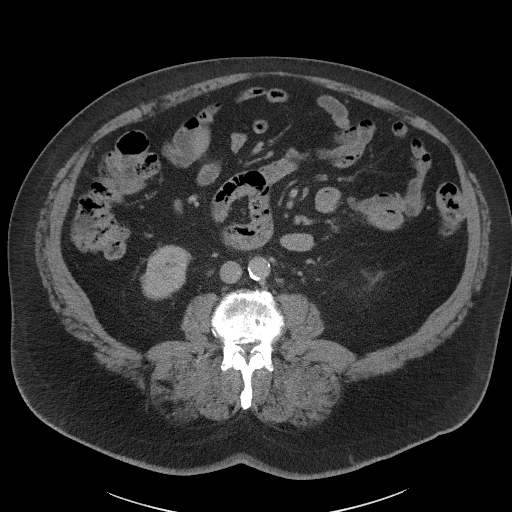
[im 66/115  lung]
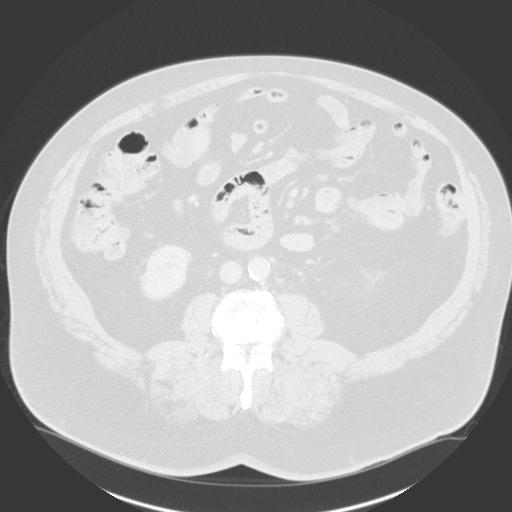
[im 82/115  soft-tissue]
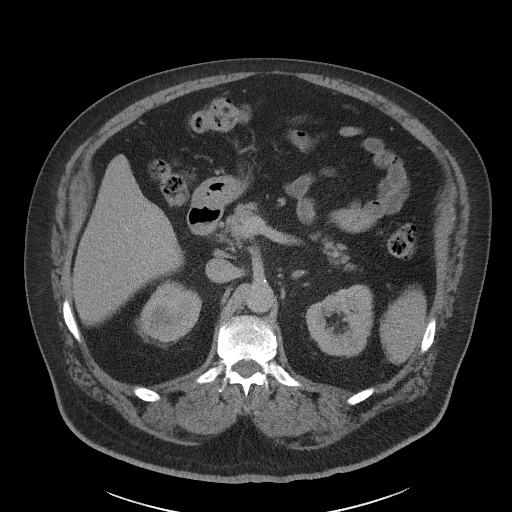
[im 82/115  lung]
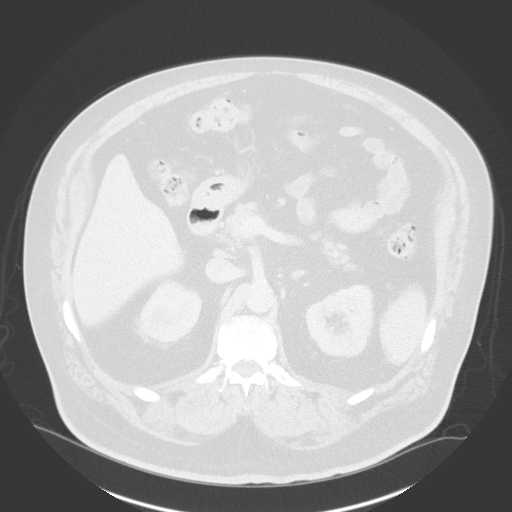
[im 98/115  soft-tissue]
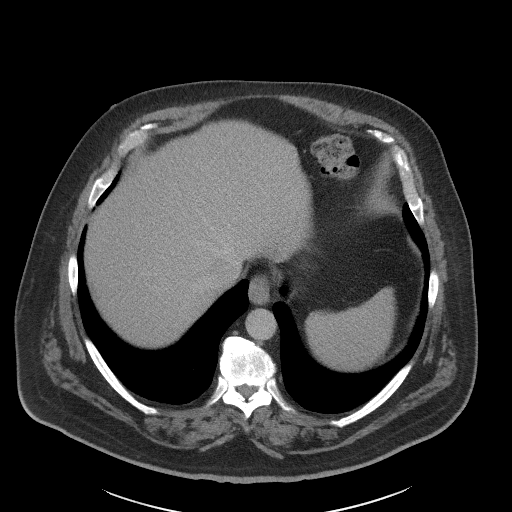
[im 98/115  lung]
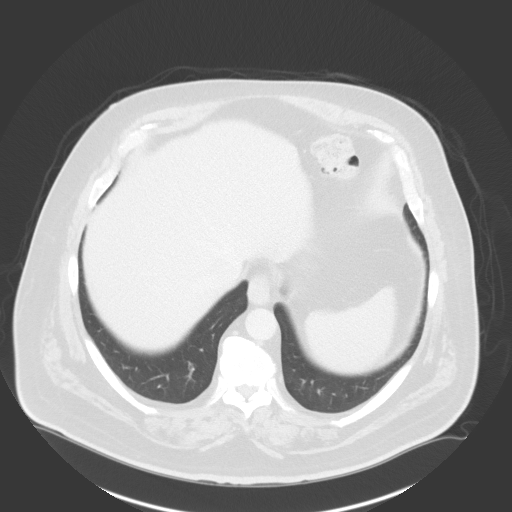

[Series 7: coronal pre · coronal · non-contrast · 1.03mm/px · 2 of 125 slices shown, 3 images]
[im 42/125  soft-tissue]
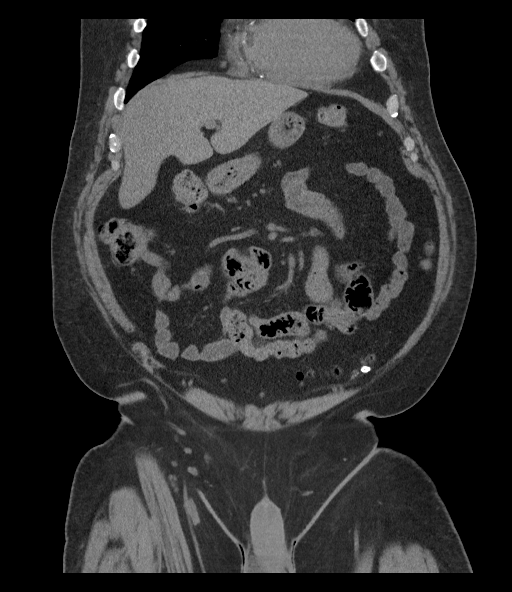
[im 42/125  bone]
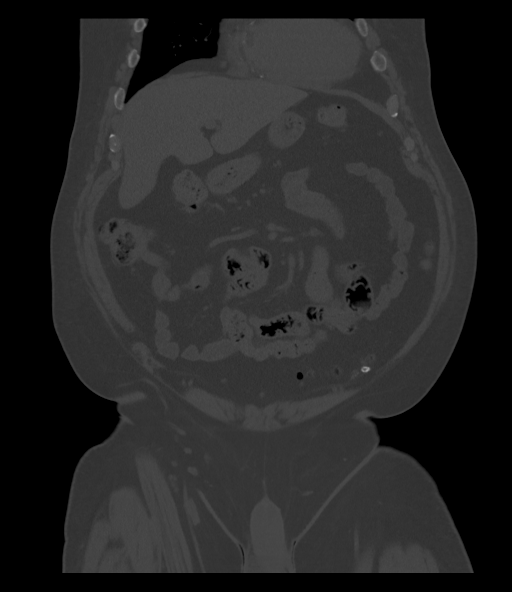
[im 83/125  soft-tissue]
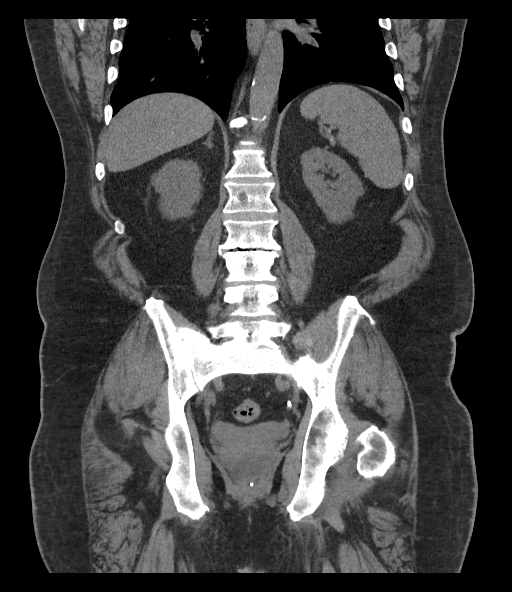

[Series 13: axial delay · axial · delayed · 0.98mm/px · z∈[-493,-258]mm · 4 of 111 slices shown]
[im 16/111  soft-tissue]
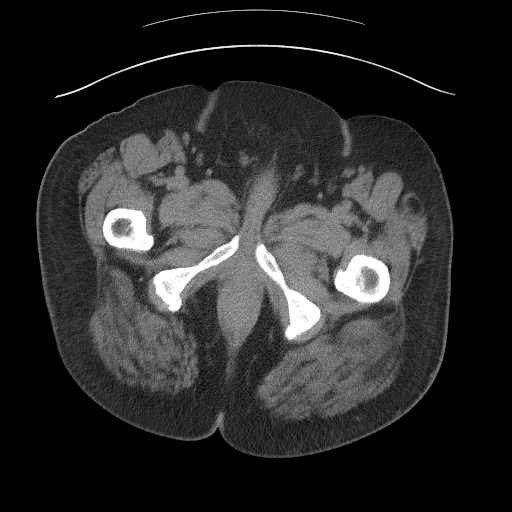
[im 32/111  soft-tissue]
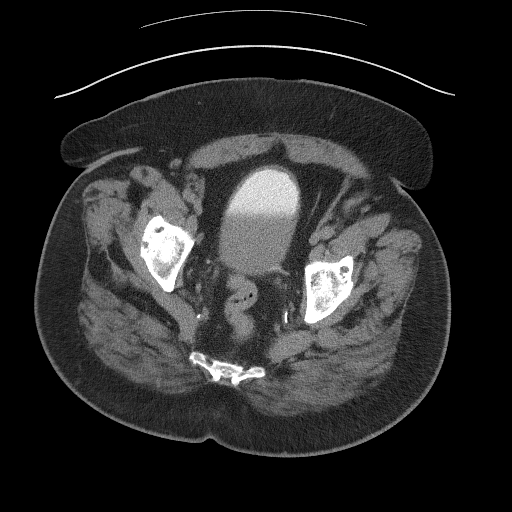
[im 48/111  soft-tissue]
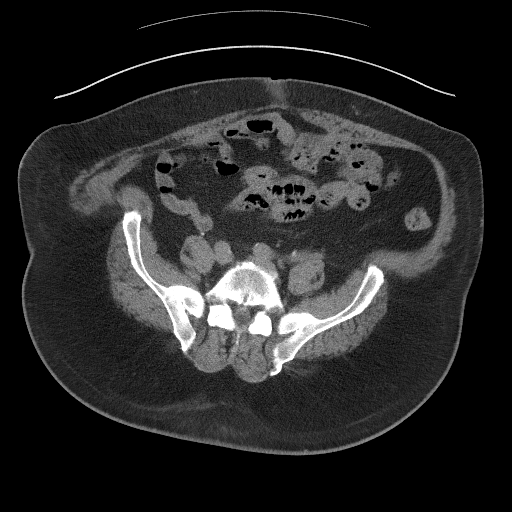
[im 63/111  soft-tissue]
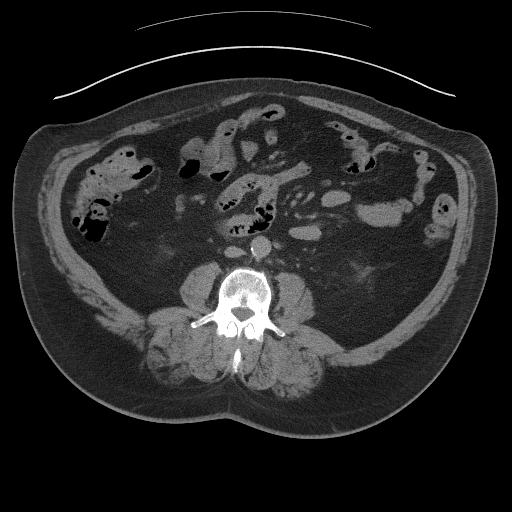

[12 of 46 positions shown; findings below may reference images not displayed]

FINDINGS: Lower chest: Probable tiny subpleural lymph node along the right
hemidiaphragm ([DATE]). Lung bases are otherwise clear. Heart is
enlarged. Atherosclerotic calcification of the aorta, aortic valve
and coronary arteries. No pericardial or effusion. Distal esophagus
is unremarkable.

Hepatobiliary: Liver is unremarkable. Cholecystectomy. No biliary
ductal dilatation.

Pancreas: Negative.

Spleen: Negative.

Adrenals/Urinary Tract: Adrenal glands are unremarkable. 2.3 cm cyst
in the upper pole right kidney. No urinary stones. Ureters are
decompressed. There is relatively poor opacification of the
intrarenal collecting systems and ureters bilaterally, limiting
evaluation. There may be minimal bladder wall thickening. Otherwise,
the opacified portion bladder is unremarkable.

Stomach/Bowel: Stomach, small bowel and colon are unremarkable.
Appendectomy.

Vascular/Lymphatic: Atherosclerotic calcification of the aorta
without aneurysm. No pathologically enlarged lymph nodes.

Reproductive: Prostate is enlarged.

Other: No free fluid.  Mesenteries and peritoneum are unremarkable.

Musculoskeletal: Degenerative changes in the spine. No worrisome
lytic or sclerotic lesions.
IMPRESSION: 1. Enlarged prostate. Possible associated mild bladder wall
thickening.
2. Intrarenal collecting systems, ureters and bladder are otherwise
poorly opacified, limiting further evaluation.
3. Aortic atherosclerosis ([id number].P). Coronary artery
calcification.

## 2020-08-19 ENCOUNTER — Ambulatory Visit: Payer: Medicare Other

## 2020-08-20 ENCOUNTER — Ambulatory Visit: Payer: Self-pay | Admitting: Urology

## 2020-08-25 NOTE — Progress Notes (Deleted)
08/26/2020 9:11 AM   Alvin Thompson 1945-06-19 [id number]  Referring provider: Kirk Ruths, MD [facility] [facility] [place],  [place] [address]  No chief complaint on file.  Urological history: 1. Elevated PSA -PSA trend Component     Latest Ref Rng & Units 11/09/2016 05/04/2017 05/21/2018 11/15/2018  Prostate Specific Ag, Serum     0.0 - 4.0 ng/mL 3.5 3.8 4.0 4.8 (H)   2. BPH with LU TS -I PSS *** -PVR *** -managed with tamsulosin 0.4 mg daily  3. High risk hematuria -Non-smoker  -CTU 08/2019 Adrenal glands are unremarkable. 2.3 cm cyst in the upper pole right kidney. No urinary stones. Ureters are decompressed. There is relatively poor opacification of the intrarenal collecting systems and ureters bilaterally, limiting evaluation. There may be minimal bladder wall thickening. Otherwise, the opacified portion bladder is unremarkable.  Prostate is enlarged. -cysto 08/2019 Enlarged prostate w/ no median lobe.  Bilopar coaptation and little blood upon manipulation w/ instrumentation.  Moderate trabeculation w/ thickening fibers  -UA ***  4. Phimosis   HPI: Alvin Thompson is a 75 y.o. male who presents today for a yearly follow up.    Score:  1-7 Mild 8-19 Moderate 20-35 Severe   PMH: Past Medical History:  Diagnosis Date  . Arthritis   . Diabetes mellitus without complication ([place])   . Heart murmur   . Hyperlipidemia   . Hypertension   . Skin cancer   . Sleep apnea     Surgical History: Past Surgical History:  Procedure Laterality Date  . APPENDECTOMY    . CATARACT EXTRACTION    . CHOLECYSTECTOMY    . PARS PLANA VITRECTOMY W/ REPAIR OF MACULAR HOLE      Home Medications:  Allergies as of 08/26/2020   No Known Allergies     Medication List       Accurate as of August 25, 2020  9:11 AM. If you have any questions, ask your nurse or doctor.        amLODipine 10 MG tablet Commonly known as: NORVASC TAKE ONE  TABLET BY MOUTH ONCE DAILY   HumaLOG KwikPen 100 UNIT/ML KiwkPen Generic drug: insulin lispro INJECT 15 UNITS SUBCUTANEOUSLY THREE TIMES DAILY WITH MEALS   Lantus SoloStar 100 UNIT/ML Solostar Pen Generic drug: insulin glargine INJECT 50 UNITS SUBCUTANEOUSLY ONCE DAILY   lisinopril 40 MG tablet Commonly known as: ZESTRIL Take 40 mg by mouth daily.   metFORMIN 1000 MG tablet Commonly known as: GLUCOPHAGE TAKE ONE TABLET BY MOUTH TWICE DAILY WITH MEALS   nystatin cream Commonly known as: MYCOSTATIN Apply 1 application topically 2 (two) times daily.   rosuvastatin 20 MG tablet Commonly known as: CRESTOR Take by mouth.   tamsulosin 0.4 MG Caps capsule Commonly known as: FLOMAX Take by mouth.   triamcinolone 0.025 % cream Commonly known as: KENALOG Apply 1 application topically 2 (two) times daily.       Allergies: No Known Allergies  Family History: Family History  Problem Relation Age of Onset  . Prostate cancer Neg Hx   . Bladder Cancer Neg Hx   . Kidney cancer Neg Hx     Social History:  reports that he has never smoked. He has never used smokeless tobacco. He reports that he does not drink alcohol and does not use drugs.  ROS: Pertinent ROS in HPI  Physical Exam: There were no vitals taken for this visit.  Constitutional:  Well nourished. Alert  and oriented, No acute distress. HEENT: [place] AT, moist mucus membranes.  Trachea midline, no masses. Cardiovascular: No clubbing, cyanosis, or edema. Respiratory: Normal respiratory effort, no increased work of breathing. GI: Abdomen is soft, non tender, non distended, no abdominal masses. Liver and spleen not palpable.  No hernias appreciated.  Stool sample for occult testing is not indicated.   GU: No CVA tenderness.  No bladder fullness or masses.  Patient with circumcised/uncircumcised phallus. ***Foreskin easily retracted***  Urethral meatus is patent.  No penile discharge. No penile lesions or rashes. Scrotum  without lesions, cysts, rashes and/or edema.  Testicles are located scrotally bilaterally. No masses are appreciated in the testicles. Left and right epididymis are normal. Rectal: Patient with  normal sphincter tone. Anus and perineum without scarring or rashes. No rectal masses are appreciated. Prostate is approximately *** grams, *** nodules are appreciated. Seminal vesicles are normal. Skin: No rashes, bruises or suspicious lesions. Lymph: No cervical or inguinal adenopathy. Neurologic: Grossly intact, no focal deficits, moving all 4 extremities. Psychiatric: Normal mood and affect.  Laboratory Data: Ref Range & Units 2 mo ago   Hemoglobin A1C 4.2 - 5.6 % 8.2High   Average Blood Glucose (Calc) mg/dL 189   Resulting [place] - LAB   Narrative Performed by [facility] - LAB Normal Range:  4.2 - 5.6%  Increased Risk: 5.7 - 6.4%  Diabetes:    >= 6.5%  Glycemic Control for adults with diabetes: <7%  Specimen Collected: 06/23/20 07:19 Last Resulted: 06/23/20 08:39  Received From: [place]  Result Received: 08/14/20 11:22   Cholesterol, Total 100 - 200 mg/dL 93Low   Triglyceride 35 - 199 mg/dL 215High   HDL (High Density Lipoprotein) Cholesterol 29.0 - 71.0 mg/dL 22.3Low   LDL Calculated 0 - 130 mg/dL 28   VLDL Cholesterol mg/dL 43   Cholesterol/HDL Ratio  4.2   Resulting Agency  [facility] - LAB  Specimen Collected: 06/23/20 07:19 Last Resulted: 06/23/20 11:25  Received From: [facility]  Result Received: 08/14/20 11:22   Glucose 70 - 110 mg/dL 98   Sodium 136 - 145 mmol/L 138   Potassium 3.6 - 5.1 mmol/L 4.5   Chloride 97 - 109 mmol/L 106   Carbon Dioxide (CO2) 22.0 - 32.0 mmol/L 23.9   Calcium 8.7 - 10.3 mg/dL 9.4   Urea Nitrogen (BUN) 7 - 25 mg/dL 49High   Creatinine 0.7 - 1.3 mg/dL 2.1High   Glomerular Filtration Rate (eGFR), MDRD Estimate >60 mL/min/1.73sq m 31Low    BUN/Crea Ratio 6.0 - 20.0 23.3High   Anion Gap w/K 6.0 - 16.0 12.6   Resulting Agency  [facility] - LAB  Specimen Collected: 06/23/20 07:19 Last Resulted: 06/23/20 11:22  Received From: [facility]  Result Received: 08/14/20 11:22    Urinalysis ***  I have reviewed the labs.   Pertinent Imaging: _0 @ _1 @ _2 @ I have independently reviewed the films.    Assessment & Plan:  ***  1. Elevated PSA -PSA pending   2. BPH with LU TS -managed with tamsulosin 0.4 mg daily  3. High risk hematuria -UA ***  4. Phimosis   No follow-ups on file.  These notes generated with voice recognition software. I apologize for typographical errors.  Zara Council, PA-C  [facility] [address]  [place], [place] [address] [phone]

## 2020-08-26 ENCOUNTER — Ambulatory Visit: Payer: Self-pay | Admitting: Urology

## 2020-08-26 DIAGNOSIS — R972 Elevated prostate specific antigen [PSA]: Secondary | ICD-10-CM

## 2020-08-26 DIAGNOSIS — N471 Phimosis: Secondary | ICD-10-CM

## 2020-08-26 DIAGNOSIS — N401 Enlarged prostate with lower urinary tract symptoms: Secondary | ICD-10-CM

## 2020-08-26 DIAGNOSIS — R319 Hematuria, unspecified: Secondary | ICD-10-CM

## 2020-09-02 ENCOUNTER — Other Ambulatory Visit: Payer: Self-pay | Admitting: Radiology

## 2020-09-03 ENCOUNTER — Other Ambulatory Visit: Payer: Self-pay

## 2020-09-03 ENCOUNTER — Ambulatory Visit
Admission: RE | Admit: 2020-09-03 | Discharge: 2020-09-03 | Disposition: A | Discharge status: Home / Self Care | Payer: Medicare Other | Source: Ambulatory Visit | Attending: Nephrology | Admitting: Nephrology

## 2020-09-03 DIAGNOSIS — N049 Nephrotic syndrome with unspecified morphologic changes: Secondary | ICD-10-CM | POA: Diagnosis present

## 2020-09-03 DIAGNOSIS — R809 Proteinuria, unspecified: Secondary | ICD-10-CM

## 2020-09-03 LAB — PROTIME-INR
INR: 1 (ref 0.8–1.2)
Prothrombin Time: 13.1 seconds (ref 11.4–15.2)

## 2020-09-03 LAB — CBC
HCT: 30.8 % — ABNORMAL LOW (ref 39.0–52.0)
Hemoglobin: 10.4 g/dL — ABNORMAL LOW (ref 13.0–17.0)
MCH: 31 pg (ref 26.0–34.0)
MCHC: 33.8 g/dL (ref 30.0–36.0)
MCV: 91.7 fL (ref 80.0–100.0)
Platelets: 255 10*3/uL (ref 150–400)
RBC: 3.36 MIL/uL — ABNORMAL LOW (ref 4.22–5.81)
RDW: 13.2 % (ref 11.5–15.5)
WBC: 8.2 10*3/uL (ref 4.0–10.5)
nRBC: 0 % (ref 0.0–0.2)

## 2020-09-03 LAB — GLUCOSE, CAPILLARY
Glucose-Capillary: 81 mg/dL (ref 70–99)
Glucose-Capillary: 99 mg/dL (ref 70–99)

## 2020-09-03 MED ORDER — SODIUM CHLORIDE 0.9 % IV SOLN: INTRAVENOUS | Status: DC

## 2020-09-03 MED ORDER — FENTANYL CITRATE (PF) 100 MCG/2ML IJ SOLN
INTRAMUSCULAR | Status: AC
  Filled 2020-09-03: qty 2

## 2020-09-03 MED ORDER — HYDRALAZINE HCL 20 MG/ML IJ SOLN
INTRAMUSCULAR | Status: AC
  Filled 2020-09-03: qty 1

## 2020-09-03 MED ORDER — MIDAZOLAM HCL 2 MG/2ML IJ SOLN
INTRAMUSCULAR | Status: AC | PRN
  Administered 2020-09-03 (×2): 1 mg via INTRAVENOUS

## 2020-09-03 MED ORDER — FENTANYL CITRATE (PF) 100 MCG/2ML IJ SOLN
INTRAMUSCULAR | Status: AC | PRN
  Administered 2020-09-03 (×2): 50 ug via INTRAVENOUS

## 2020-09-03 MED ORDER — MIDAZOLAM HCL 2 MG/2ML IJ SOLN
INTRAMUSCULAR | Status: AC
  Filled 2020-09-03: qty 2

## 2020-09-03 NOTE — Discharge Instructions (Signed)
Moderate Conscious Sedation, Adult, Care After This sheet gives you information about how to care for yourself after your procedure. Your health care provider may also give you more specific instructions. If you have problems or questions, contact your health care provider. What can I expect after the procedure? After the procedure, it is common to have:  Sleepiness for several hours.  Impaired judgment for several hours.  Difficulty with balance.  Vomiting if you eat too soon. Follow these instructions at home: For the time period you were told by your health care provider:  Rest.  Do not participate in activities where you could fall or become injured.  Do not drive or use machinery.  Do not drink alcohol.  Do not take sleeping pills or medicines that cause drowsiness.  Do not make important decisions or sign legal documents.  Do not take care of children on your own.      Eating and drinking  Follow the diet recommended by your health care provider.  Drink enough fluid to keep your urine pale yellow.  If you vomit: ? Drink water, juice, or soup when you can drink without vomiting. ? Make sure you have little or no nausea before eating solid foods.   General instructions  Take over-the-counter and prescription medicines only as told by your health care provider.  Have a responsible adult stay with you for the time you are told. It is important to have someone help care for you until you are awake and alert.  Do not smoke.  Keep all follow-up visits as told by your health care provider. This is important. Contact a health care provider if:  You are still sleepy or having trouble with balance after 24 hours.  You feel light-headed.  You keep feeling nauseous or you keep vomiting.  You develop a rash.  You have a fever.  You have redness or swelling around the IV site. Get help right away if:  You have trouble breathing.  You have new-onset confusion at  home. Summary  After the procedure, it is common to feel sleepy, have impaired judgment, or feel nauseous if you eat too soon.  Rest after you get home. Know the things you should not do after the procedure.  Follow the diet recommended by your health care provider and drink enough fluid to keep your urine pale yellow.  Get help right away if you have trouble breathing or new-onset confusion at home. This information is not intended to replace advice given to you by your health care provider. Make sure you discuss any questions you have with your health care provider. Document Revised: 08/16/2019 Document Reviewed: 03/14/2019 Elsevier Patient Education  2021 [place] [employer]. Percutaneous Kidney Biopsy, Care After This sheet gives you information about how to care for yourself after your procedure. Your health care provider may also give you more specific instructions. If you have problems or questions, contact your health care provider. What can I expect after the procedure? After the procedure, it is common to have:  Pain or soreness near the biopsy site.  Pink or cloudy urine for 24 hours after the procedure. This is normal. Follow these instructions at home: Activity  Return to your normal activities as told by your health care provider. Ask your health care provider what activities are safe for you.  If you were given a sedative during the procedure, it can affect you for several hours. Do not drive or operate machinery until your health care provider says  that it is safe.  Do not lift anything that is heavier than 10 lb (4.5 kg), or the limit that you are told, until your health care provider says that it is safe.  Avoid activities that take a lot of effort until your health care provider approves. Most people will have to wait 2 weeks before returning to activities such as exercise or sex. General instructions  Take over-the-counter and prescription medicines only as told by your  health care provider.  Follow instructions from your health care provider about eating or drinking restrictions.  Check your biopsy site every day for signs of infection. Check for: ? More redness, swelling, or pain. ? Fluid or blood. ? Warmth. ? Pus or a bad smell.  Keep all follow-up visits as told by your health care provider. This is important.   Contact a health care provider if:  You have more redness, swelling, or pain around your biopsy site.  You have fluid or blood coming from your biopsy site.  Your biopsy site feels warm to the touch.  You have pus or a bad smell coming from your biopsy site.  You have blood in your urine more than 24 hours after your procedure. Get help right away if:  Your urine is dark red or brown.  You have a fever.  You are not able to urinate.  You feel burning when you urinate.  You feel dizzy or light-headed.  You have severe pain in your abdomen or side. Summary  After the procedure, it is common to have pain or soreness at the biopsy site and pink or cloudy urine for the first 24 hours.  Check your biopsy site each day for signs of infection, such as more redness, swelling, or pain; fluid, blood, pus or a bad smell coming from the biopsy site; or the biopsy site feeling warm to the touch.  Return to your normal activities as told by your health care provider. This information is not intended to replace advice given to you by your health care provider. Make sure you discuss any questions you have with your health care provider. Document Revised: 07/12/2019 Document Reviewed: 07/12/2019 Elsevier Patient Education  [place].

## 2020-09-03 NOTE — Procedures (Signed)
Interventional Radiology Procedure Note  Procedure: US Guided Biopsy of right kidney  Complications: None  Estimated Blood Loss: < 10 mL  Findings: 16 G core biopsy of right lower pole renal cortex performed under US guidance.  Two core samples obtained and sent to Pathology.  Venetia Night. Kathlene Cote, M.D Pager:  [phone]

## 2020-09-03 NOTE — H&P (Signed)
Chief Complaint: Patient was seen in consultation today for renal bx at the request of Kolluru,Sarath  Referring Physician(s): Kolluru,Sarath  Supervising Physician: Aletta Edouard  Patient Status: [place]  History of Present Illness: Alvin Thompson is a 75 y.o. male being worked up for proteinuria. He is referred for image guided random renal biopsy. PMHx, meds, labs, imaging, allergies reviewed. Feels well, no recent fevers, chills, illness. Has been NPO today as directed.    Past Medical History:  Diagnosis Date  . Arthritis   . Diabetes mellitus without complication ([place])   . Heart murmur   . Hyperlipidemia   . Hypertension   . Skin cancer   . Sleep apnea     Past Surgical History:  Procedure Laterality Date  . APPENDECTOMY    . CATARACT EXTRACTION    . CHOLECYSTECTOMY    . PARS PLANA VITRECTOMY W/ REPAIR OF MACULAR HOLE      Allergies: Patient has no known allergies.  Medications: Prior to Admission medications   Medication Sig Start Date End Date Taking? Authorizing Provider  amLODipine (NORVASC) 10 MG tablet TAKE ONE TABLET BY MOUTH ONCE DAILY 06/07/16  Yes [provider]  insulin glargine (LANTUS) 100 UNIT/ML Solostar Pen INJECT 50 UNITS SUBCUTANEOUSLY ONCE DAILY 07/13/16  Yes [provider]  insulin lispro (HUMALOG) 100 UNIT/ML KiwkPen INJECT 15 UNITS SUBCUTANEOUSLY THREE TIMES DAILY WITH MEALS 10/10/16  Yes [provider]  lisinopril (PRINIVIL,ZESTRIL) 40 MG tablet Take 40 mg by mouth daily. 10/04/17  Yes [provider]  metFORMIN (GLUCOPHAGE) 1000 MG tablet TAKE ONE TABLET BY MOUTH TWICE DAILY WITH MEALS 10/10/16  Yes [provider]  rosuvastatin (CRESTOR) 20 MG tablet Take by mouth. 02/01/16  Yes [provider]  tamsulosin (FLOMAX) 0.4 MG CAPS capsule Take by mouth. 02/01/16  Yes [provider]  nystatin cream (MYCOSTATIN) Apply 1 application topically 2 (two) times  daily. Patient not taking: Reported on 09/03/2020 08/21/19   Hollice Espy, MD  triamcinolone (KENALOG) 0.025 % cream Apply 1 application topically 2 (two) times daily. Patient not taking: Reported on 09/03/2020 08/21/19   Hollice Espy, MD     Family History  Problem Relation Age of Onset  . Prostate cancer Neg Hx   . Bladder Cancer Neg Hx   . Kidney cancer Neg Hx     Social History   Socioeconomic History  . Marital status: Married    Spouse name: Not on file  . Number of children: Not on file  . Years of education: Not on file  . Highest education level: Not on file  Occupational History  . Not on file  Tobacco Use  . Smoking status: Never Smoker  . Smokeless tobacco: Never Used  Substance and Sexual Activity  . Alcohol use: No  . Drug use: No  . Sexual activity: Not on file  Other Topics Concern  . Not on file  Social History Narrative  . Not on file   Social Determinants of Health   Financial Resource Strain: Not on file  Food Insecurity: Not on file  Transportation Needs: Not on file  Physical Activity: Not on file  Stress: Not on file  Social Connections: Not on file    Review of Systems: A 12 point ROS discussed and pertinent positives are indicated in the HPI above.  All other systems are negative.  Review of Systems  Vital Signs: BP (!) 154/65   Pulse (!) 59   Temp 97.9 F (36.6  C)   Resp 20   Ht 5\' 10"  (1.778 m)   Wt 118.8 kg   SpO2 99%   BMI 37.59 kg/m   Physical Exam Constitutional:      Appearance: Normal appearance.  HENT:     Mouth/Throat:     Mouth: Mucous membranes are moist.     Pharynx: Oropharynx is clear.  Cardiovascular:     Rate and Rhythm: Normal rate and regular rhythm.     Heart sounds: Normal heart sounds.  Pulmonary:     Effort: Pulmonary effort is normal. No respiratory distress.     Breath sounds: Normal breath sounds.  Abdominal:     General: Abdomen is flat. There is no distension.     Palpations: Abdomen is  soft. There is no mass.     Tenderness: There is no abdominal tenderness.  Skin:    General: Skin is warm and dry.  Neurological:     General: No focal deficit present.     Mental Status: He is alert and oriented to person, place, and time.  Psychiatric:        Mood and Affect: Mood normal.        Thought Content: Thought content normal.        Judgment: Judgment normal.       Imaging: No results found.  Labs:  CBC: Recent Labs    09/03/20 0758  WBC 8.2  HGB 10.4*  HCT 30.8*  PLT 255    COAGS: No results for input(s): INR, APTT in the last 8760 hours.  BMP: No results for input(s): NA, K, CL, CO2, GLUCOSE, BUN, CALCIUM, CREATININE, GFRNONAA, GFRAA in the last 8760 hours.  Invalid input(s): CMP  LIVER FUNCTION TESTS: No results for input(s): BILITOT, AST, ALT, ALKPHOS, PROT, ALBUMIN in the last 8760 hours.  TUMOR MARKERS: No results for input(s): AFPTM, CEA, CA199, CHROMGRNA in the last 8760 hours.  Assessment and Plan: Proteinuria For US guided random renal bx Labs ok Risks and benefits of renal bx was discussed with the patient and/or patient's family including, but not limited to bleeding, infection, damage to adjacent structures or low yield requiring additional tests.  All of the questions were answered and there is agreement to proceed.  Consent signed and in chart.    Thank you for this interesting consult.  I greatly enjoyed meeting Alvin Thompson and look forward to participating in their care.  A copy of this report was sent to the requesting provider on this date.  Electronically Signed: Ascencion Dike, PA-C 09/03/2020, 8:32 AM   I spent a total of 20 minutes in face to face in clinical consultation, greater than 50% of which was counseling/coordinating care for renal bx

## 2020-09-03 NOTE — Progress Notes (Signed)
Patient clinically stable post Renal biopsy per Dr Kathlene Cote, tolerated well. Vitals stable pre and post procedure. Denies complaints at this time. Received Versed 2 mg along with Fentanyl 100 mcg IV for procedure. Report given to [facility] in specials post procedure.

## 2020-09-09 NOTE — Progress Notes (Signed)
09/10/2020 3:50 PM   Alvin Thompson 07-24-1945 [id number]  Referring provider: Kirk Ruths, MD [facility] [place],  [place] [address]  Chief Complaint  Patient presents with  . Hematuria   Urological history: 1. Elevated PSA -PSA trend Component     Latest Ref Rng & Units 11/09/2016 05/04/2017 05/21/2018 11/15/2018  Prostate Specific Ag, Serum     0.0 - 4.0 ng/mL 3.5 3.8 4.0 4.8 (H)   2. BPH with LU TS -I PSS 6/1 -managed with tamsulosin 0.4 mg daily  3. High risk hematuria -Non-smoker  -CTU 08/2019 Adrenal glands are unremarkable. 2.3 cm cyst in the upper pole right kidney. No urinary stones. Ureters are decompressed. There is relatively poor opacification of the intrarenal collecting systems and ureters bilaterally, limiting evaluation. There may be minimal bladder wall thickening. Otherwise, the opacified portion bladder is unremarkable.  Prostate is enlarged. -cysto 08/2019 Enlarged prostate w/ no median lobe.  Bilopar coaptation and little blood upon manipulation w/ instrumentation.  Moderate trabeculation w/ thickening fibers  -UA negative for micro heme   -underwent renal biopsy on 09/03/2020 for persistent proteinuria and nephrotic syndrome - pathology pending at the time of this visit   4. Phimosis   HPI: Alvin Thompson is a 75 y.o. male who presents today for a yearly follow up.  He has urinary frequency and urge incontinence that he attributes to his diuretics because the symptoms do not occur when he is off his diuretics.  Patient denies any modifying or aggravating factors.  Patient denies any gross hematuria, dysuria or suprapubic/flank pain.  Patient denies any fevers, chills, nausea or vomiting.    IPSS    Row Name 09/10/20 1100         International Prostate Symptom Score   How often have you had the sensation of not emptying your bladder? Not at All     How often have you had to urinate less than  every two hours? Less than 1 in 5 times     How often have you found you stopped and started again several times when you urinated? Less than 1 in 5 times     How often have you found it difficult to postpone urination? Less than 1 in 5 times     How often have you had a weak urinary stream? Less than 1 in 5 times     How often have you had to strain to start urination? Not at All     How many times did you typically get up at night to urinate? 2 Times     Total IPSS Score 6           Quality of Life due to urinary symptoms   If you were to spend the rest of your life with your urinary condition just the way it is now how would you feel about that? Pleased            Score:  1-7 Mild 8-19 Moderate 20-35 Severe   Patient still experiencing spontaneous erections during the night, but he states it is not a full erection but about two thirds.  He denies any pain with erections or curvature with erections.    SHIM    Row Name 09/10/20 1132         SHIM: Over the last 6 months:   How do you rate your confidence that you could get and keep an erection? Very Low  When you had erections with sexual stimulation, how often were your erections hard enough for penetration (entering your partner)? Almost Never or Never     During sexual intercourse, how often were you able to maintain your erection after you had penetrated (entered) your partner? Almost Never or Never     During sexual intercourse, how difficult was it to maintain your erection to completion of intercourse? Extremely Difficult     When you attempted sexual intercourse, how often was it satisfactory for you? Almost Never or Never           SHIM Total Score   SHIM 5            Score: 1-7 Severe ED 8-11 Moderate ED 12-16 Mild-Moderate ED 17-21 Mild ED 22-25 No ED   PMH: Past Medical History:  Diagnosis Date  . Arthritis   . Diabetes mellitus without complication ([place])   . Heart murmur   . Hyperlipidemia    . Hypertension   . Skin cancer   . Sleep apnea     Surgical History: Past Surgical History:  Procedure Laterality Date  . APPENDECTOMY    . CATARACT EXTRACTION    . CHOLECYSTECTOMY    . PARS PLANA VITRECTOMY W/ REPAIR OF MACULAR HOLE      Home Medications:  Allergies as of 09/10/2020   No Known Allergies     Medication List       Accurate as of Sep 10, 2020 11:59 PM. If you have any questions, ask your nurse or doctor.        STOP taking these medications   lisinopril 40 MG tablet Commonly known as: ZESTRIL Stopped by: Alejandro Adcox, PA-C   nystatin cream Commonly known as: MYCOSTATIN Stopped by: Cerinity Zynda, PA-C   triamcinolone 0.025 % cream Commonly known as: KENALOG Stopped by: Mena Simonis, PA-C     TAKE these medications   amLODipine 10 MG tablet Commonly known as: NORVASC TAKE ONE TABLET BY MOUTH ONCE DAILY   insulin glargine 100 UNIT/ML Solostar Pen Commonly known as: LANTUS INJECT 50 UNITS SUBCUTANEOUSLY ONCE DAILY   insulin lispro 100 UNIT/ML KiwkPen Commonly known as: HUMALOG INJECT 15 UNITS SUBCUTANEOUSLY THREE TIMES DAILY WITH MEALS   metFORMIN 1000 MG tablet Commonly known as: GLUCOPHAGE TAKE ONE TABLET BY MOUTH TWICE DAILY WITH MEALS   Muse 500 MCG Pllt Generic drug: Alprostadil (Vasodilator) Place 1 each into the urethra as needed. Started by: Zara Council, PA-C   rosuvastatin 20 MG tablet Commonly known as: CRESTOR Take by mouth.   tamsulosin 0.4 MG Caps capsule Commonly known as: FLOMAX Take by mouth.       Allergies: No Known Allergies  Family History: Family History  Problem Relation Age of Onset  . Prostate cancer Neg Hx   . Bladder Cancer Neg Hx   . Kidney cancer Neg Hx     Social History:  reports that he has never smoked. He has never used smokeless tobacco. He reports that he does not drink alcohol and does not use drugs.  ROS: Pertinent ROS in HPI  Physical Exam: BP 130/75   Pulse 78   Ht  5' 10"  (1.778 m)   Wt 268 lb (121.6 kg)   BMI 38.45 kg/m   Constitutional:  Well nourished. Alert and oriented, No acute distress. HEENT: [place] AT, mask in place.  Trachea midline Cardiovascular: No clubbing, cyanosis, or edema. Respiratory: Normal respiratory effort, no increased work of breathing. GU: No CVA tenderness.  No  bladder fullness or masses.  Patient with uncircumcised phallus.  Foreskin easily retracted Urethral meatus is patent.  No penile discharge. No penile lesions or rashes. Scrotum without lesions, cysts, rashes and/or edema.  Testicles are located scrotally bilaterally. No masses are appreciated in the testicles. Left and right epididymis are normal. Rectal: Patient with  normal sphincter tone. Anus and perineum without scarring or rashes. No rectal masses are appreciated. Prostate is approximately 60 + grams, could only palpate the apex, no nodules are appreciated. Seminal vesicles could not be palpated.  Neurologic: Grossly intact, no focal deficits, moving all 4 extremities. Psychiatric: Normal mood and affect.  Laboratory Data: Ref Range & Units 2 mo ago   Hemoglobin A1C 4.2 - 5.6 % 8.2High   Average Blood Glucose (Calc) mg/dL 189   Resulting [place] - LAB   Narrative Performed by [facility] - LAB Normal Range:  4.2 - 5.6%  Increased Risk: 5.7 - 6.4%  Diabetes:    >= 6.5%  Glycemic Control for adults with diabetes: <7%  Specimen Collected: 06/23/20 07:19 Last Resulted: 06/23/20 08:39  Received From: [facility]  Result Received: 08/14/20 11:22   Cholesterol, Total 100 - 200 mg/dL 93Low   Triglyceride 35 - 199 mg/dL 215High   HDL (High Density Lipoprotein) Cholesterol 29.0 - 71.0 mg/dL 22.3Low   LDL Calculated 0 - 130 mg/dL 28   VLDL Cholesterol mg/dL 43   Cholesterol/HDL Ratio  4.2   Resulting [place] - LAB  Specimen Collected: 06/23/20 07:19 Last Resulted: 06/23/20 11:25   Received From: [facility]  Result Received: 08/14/20 11:22   Glucose 70 - 110 mg/dL 98   Sodium 136 - 145 mmol/L 138   Potassium 3.6 - 5.1 mmol/L 4.5   Chloride 97 - 109 mmol/L 106   Carbon Dioxide (CO2) 22.0 - 32.0 mmol/L 23.9   Calcium 8.7 - 10.3 mg/dL 9.4   Urea Nitrogen (BUN) 7 - 25 mg/dL 49High   Creatinine 0.7 - 1.3 mg/dL 2.1High   Glomerular Filtration Rate (eGFR), MDRD Estimate >60 mL/min/1.73sq m 31Low   BUN/Crea Ratio 6.0 - 20.0 23.3High   Anion Gap w/K 6.0 - 16.0 12.6   Resulting Agency  [facility] - LAB  Specimen Collected: 06/23/20 07:19 Last Resulted: 06/23/20 11:22  Received From: [facility]  Result Received: 08/14/20 11:22    Urinalysis Component     Latest Ref Rng & Units 09/10/2020  Specific Gravity, UA     1.005 - 1.030 1.015  pH, UA     5.0 - 7.5 5.0  Color, UA     Yellow Yellow  Appearance Ur     Clear Clear  Leukocytes,UA     Negative Negative  Protein,UA     Negative/Trace 2+ (A)  Glucose, UA     Negative 2+ (A)  Ketones, UA     Negative Negative  RBC, UA     Negative Trace (A)  Bilirubin, UA     Negative Negative  Urobilinogen, Ur     0.2 - 1.0 mg/dL 0.2  Nitrite, UA     Negative Negative  Microscopic Examination      See below:   Component     Latest Ref Rng & Units 09/10/2020  WBC, UA     0 - 5 /hpf 0-5  RBC     0 - 2 /hpf 0-2  Epithelial Cells (non renal)  0 - 10 /hpf None seen  Casts     None seen /lpf Present (A)  Cast Type     N/A Hyaline casts  Bacteria, UA     None seen/Few None seen  I have reviewed the labs.   Pertinent Imaging: No recent imaging  Assessment & Plan:    1. Elevated PSA -PSA pending   2. BPH with LU TS -managed with tamsulosin 0.4 mg daily-filled by PCP  3. High risk hematuria -UA negative for micro heme  -no reports of gross hematuria  4. Phimosis -able to retract foreskin  5. ED -patient failed PDE5i's -not interested in  [place], penile prothesis or vacuum erect aid device  -his insurance may cover MUSE -sent in a script for MUSE 500 mg -Explained to the patient that the medication needs to be refrigerated until it is time to use.  He should take it out other refrigerator about 15 to 20 minutes prior to use.  I explained that if he comes with the applicator and needs to be inserted within the urethral meatus and then he is to massage the penis to facilitate melting of the palate into the erectile tissue.  I explained that the pellet may cause a burning sensation in the urethra and also burning sensation in his partner and this is to be expected   Return in about 1 year (around 09/10/2021) for UA, PSA, I PSS, SHIM and exam.  These notes generated with voice recognition software. I apologize for typographical errors.  Zara Council, PA-C  [facility] [address]  [place], [place] [address] [phone]

## 2020-09-10 ENCOUNTER — Encounter: Payer: Self-pay | Admitting: Urology

## 2020-09-10 ENCOUNTER — Encounter: Payer: Self-pay | Admitting: Nephrology

## 2020-09-10 ENCOUNTER — Ambulatory Visit: Payer: Medicare Other | Admitting: Urology

## 2020-09-10 ENCOUNTER — Other Ambulatory Visit: Payer: Self-pay

## 2020-09-10 VITALS — BP 130/75 | HR 78 | Ht 70.0 in | Wt 268.0 lb

## 2020-09-10 DIAGNOSIS — N401 Enlarged prostate with lower urinary tract symptoms: Secondary | ICD-10-CM | POA: Diagnosis not present

## 2020-09-10 DIAGNOSIS — N529 Male erectile dysfunction, unspecified: Secondary | ICD-10-CM

## 2020-09-10 DIAGNOSIS — R319 Hematuria, unspecified: Secondary | ICD-10-CM | POA: Diagnosis not present

## 2020-09-10 DIAGNOSIS — N138 Other obstructive and reflux uropathy: Secondary | ICD-10-CM

## 2020-09-10 DIAGNOSIS — R972 Elevated prostate specific antigen [PSA]: Secondary | ICD-10-CM | POA: Diagnosis not present

## 2020-09-10 DIAGNOSIS — N471 Phimosis: Secondary | ICD-10-CM | POA: Diagnosis not present

## 2020-09-10 LAB — MICROSCOPIC EXAMINATION
Bacteria, UA: NONE SEEN
Epithelial Cells (non renal): NONE SEEN /hpf (ref 0–10)

## 2020-09-10 LAB — URINALYSIS, COMPLETE
Bilirubin, UA: NEGATIVE
Ketones, UA: NEGATIVE
Leukocytes,UA: NEGATIVE
Nitrite, UA: NEGATIVE
Specific Gravity, UA: 1.015 (ref 1.005–1.030)
Urobilinogen, Ur: 0.2 mg/dL (ref 0.2–1.0)
pH, UA: 5 (ref 5.0–7.5)

## 2020-09-10 LAB — SURGICAL PATHOLOGY

## 2020-09-10 MED ORDER — MUSE 500 MCG UR PLLT
1.0000 | PELLET | URETHRAL | 0 refills | Status: DC | PRN
Start: 2020-09-10 — End: 2021-03-17

## 2020-09-10 NOTE — Patient Instructions (Signed)
Alprostadil Urethral Suppository What is this medicine? ALPROSTADIL (al PROS ta dil) is used to treat erectile dysfunction (ED). This medicine helps to create and maintain an erection. This medicine may be used for other purposes; ask your health care provider or pharmacist if you have questions. COMMON BRAND NAME(S): Muse What should I tell my health care provider before I take this medicine? They need to know if you have, or have had any of these conditions:  an abnormally formed penis  bleeding disorder  have a condition that might cause a painful, prolonged erection, such as sickle cell disease or trait, leukemia, or multiple myeloma  have been advised not to engage in sexual activity  low blood pressure  penile implant  trouble passing urine  an unusual or allergic reaction to alprostadil, other medicines, foods, dyes, or preservatives  if your partner is pregnant or intends to become pregnant How should I use this medicine? This medicine is for use in the penis only. You will be taught how to prepare and use it. Wash your hands before and after use. Use it as directed on the prescription label. Do not use more often than directed. Make sure that you are using your medicine correctly. Ask your doctor or health care provider if you have any questions. A patient package insert for the product will be given with each prescription and refill. Be sure to read this information carefully each time. The sheet may change often. Talk to your health care provider about the use of this medicine in children. It is not approved for use in children. Overdosage: If you think you have taken too much of this medicine contact a poison control center or emergency room at once. NOTE: This medicine is only for you. Do not share this medicine with others. What if I miss a dose? This does not apply. This medicine is not for regular use. This medicine should only be used as needed to achieve an erection.  Do not use more than 2 times in each 24-hour period. What may interact with this medicine?  medicines for blood pressure This list may not describe all possible interactions. Give your health care provider a list of all the medicines, herbs, non-prescription drugs, or dietary supplements you use. Also tell them if you smoke, drink alcohol, or use illegal drugs. Some items may interact with your medicine. What should I watch for while using this medicine? Visit your health care provider for regular checks on your progress. If you experience dizziness or feel faint during sexual intercourse, this may be due to low blood pressure. Lie down immediately and raise your legs. If symptoms persist, call your doctor promptly. Men should inform their doctors if they wish to father a child. There is potential for serious harm to an unborn child. Talk to your health care provider for more information. Inform your doctor if your male partner is pregnant or wishes to become pregnant. This medicine does not provide birth control. It is recommended that you use a condom during sexual intercourse. This medicine does not protect you or your partner against HIV infection (AIDS) or any other sexually transmitted diseases. What side effects may I notice from receiving this medicine? Side effects that you should report to your doctor or health care professional as soon as possible:  allergic reactions (skin rash, itching or hives; swelling of the face, lips, or tongue)  fast, irregular heartbeat  low blood pressure (dizziness; feeling faint or lightheaded, falls; unusually weak or  tired)  prolonged or painful erection  swelling of the ankles, feet, legs  trouble passing urine Side effects that usually do not require medical attention (report to your doctor or health care professional if they continue or are bothersome):  aching in the penis, testicles, legs, or in the perineum (area between the penis and  rectum)  redness of the penis due to increased blood flow  small amount of bleeding or spotting from the penis due to improper use  warmth or burning sensation at site of insertion This list may not describe all possible side effects. Call your doctor for medical advice about side effects. You may report side effects to FDA at 1-800-FDA-1088. Where should I keep my medicine? Keep out of reach of children and pets. Refrigeration (preferred): Store in the refrigerator. Do not freeze. Keep unopened medicine in the pouch. Protect from heat and light. Do not open until you are ready to use it. Get rid of any unused medicine after the expiration date. Room Temperature: This medicine may be stored at room temperature for up to 14 days. Keep unopened medicine in the pouch. Do not open until you are ready to use it. Do not expose to temperatures above 30 degrees C (86 degrees F). Do not expose to direct sunlight. When traveling, store this medicine in a portable ice pack or cooler. Do not store in the trunk of a car or in baggage storage areas. Get rid of any unused medicine after 14 days. To get rid of medicines that are no longer needed or have expired:  Take the medicine to a medicine take-back program. Check with your pharmacy or law enforcement to find a location.  If you cannot return the medicine, ask your pharmacist or health care provider how to get rid of this medicine safely. NOTE: This sheet is a summary. It may not cover all possible information. If you have questions about this medicine, talk to your doctor, pharmacist, or health care provider.  2021 Elsevier/Gold Standard (2019-12-10 13:43:09)

## 2020-09-11 LAB — PSA: Prostate Specific Ag, Serum: 6 ng/mL — ABNORMAL HIGH (ref 0.0–4.0)

## 2020-10-01 ENCOUNTER — Other Ambulatory Visit: Payer: Self-pay

## 2020-10-01 ENCOUNTER — Encounter: Payer: Self-pay | Admitting: Oncology

## 2020-10-01 ENCOUNTER — Inpatient Hospital Stay: Payer: Medicare Other | Attending: Oncology | Admitting: Oncology

## 2020-10-01 ENCOUNTER — Inpatient Hospital Stay: Payer: Medicare Other

## 2020-10-01 VITALS — BP 144/53 | HR 65 | Temp 97.9°F | Ht 70.0 in | Wt 270.9 lb

## 2020-10-01 DIAGNOSIS — N051 Unspecified nephritic syndrome with focal and segmental glomerular lesions: Secondary | ICD-10-CM | POA: Insufficient documentation

## 2020-10-01 DIAGNOSIS — Z794 Long term (current) use of insulin: Secondary | ICD-10-CM | POA: Insufficient documentation

## 2020-10-01 DIAGNOSIS — D649 Anemia, unspecified: Secondary | ICD-10-CM | POA: Insufficient documentation

## 2020-10-01 DIAGNOSIS — E1165 Type 2 diabetes mellitus with hyperglycemia: Secondary | ICD-10-CM | POA: Insufficient documentation

## 2020-10-01 DIAGNOSIS — R972 Elevated prostate specific antigen [PSA]: Secondary | ICD-10-CM | POA: Diagnosis not present

## 2020-10-01 LAB — CBC WITH DIFFERENTIAL/PLATELET
Abs Immature Granulocytes: 0.02 10*3/uL (ref 0.00–0.07)
Basophils Absolute: 0 10*3/uL (ref 0.0–0.1)
Basophils Relative: 1 %
Eosinophils Absolute: 0.3 10*3/uL (ref 0.0–0.5)
Eosinophils Relative: 3 %
HCT: 32.2 % — ABNORMAL LOW (ref 39.0–52.0)
Hemoglobin: 10.9 g/dL — ABNORMAL LOW (ref 13.0–17.0)
Immature Granulocytes: 0 %
Lymphocytes Relative: 19 %
Lymphs Abs: 1.5 10*3/uL (ref 0.7–4.0)
MCH: 31.6 pg (ref 26.0–34.0)
MCHC: 33.9 g/dL (ref 30.0–36.0)
MCV: 93.3 fL (ref 80.0–100.0)
Monocytes Absolute: 0.6 10*3/uL (ref 0.1–1.0)
Monocytes Relative: 7 %
Neutro Abs: 5.7 10*3/uL (ref 1.7–7.7)
Neutrophils Relative %: 70 %
Platelets: 250 10*3/uL (ref 150–400)
RBC: 3.45 MIL/uL — ABNORMAL LOW (ref 4.22–5.81)
RDW: 12.5 % (ref 11.5–15.5)
WBC: 8.1 10*3/uL (ref 4.0–10.5)
nRBC: 0 % (ref 0.0–0.2)

## 2020-10-01 LAB — COMPREHENSIVE METABOLIC PANEL
ALT: 19 U/L (ref 0–44)
AST: 19 U/L (ref 15–41)
Albumin: 4 g/dL (ref 3.5–5.0)
Alkaline Phosphatase: 57 U/L (ref 38–126)
Anion gap: 11 (ref 5–15)
BUN: 33 mg/dL — ABNORMAL HIGH (ref 8–23)
CO2: 24 mmol/L (ref 22–32)
Calcium: 9 mg/dL (ref 8.9–10.3)
Chloride: 102 mmol/L (ref 98–111)
Creatinine, Ser: 2.03 mg/dL — ABNORMAL HIGH (ref 0.61–1.24)
GFR, Estimated: 34 mL/min — ABNORMAL LOW (ref >60)
Glucose, Bld: 303 mg/dL — ABNORMAL HIGH (ref 70–99)
Potassium: 4.9 mmol/L (ref 3.5–5.1)
Sodium: 137 mmol/L (ref 135–145)
Total Bilirubin: 0.9 mg/dL (ref 0.3–1.2)
Total Protein: 7.1 g/dL (ref 6.5–8.1)

## 2020-10-01 LAB — LACTATE DEHYDROGENASE: LDH: 135 U/L (ref 98–192)

## 2020-10-01 LAB — PSA: Prostatic Specific Antigen: 6.06 ng/mL — ABNORMAL HIGH (ref 0.00–4.00)

## 2020-10-01 NOTE — Progress Notes (Signed)
[facility]  Telephone:(336) [phone] Fax:(336) [phone]  ID: Alvin Thompson OB: 1946-03-19  MR#: [id number]  JOI#:[id number]  Patient Care Team: Kirk Ruths, MD as PCP - General (Internal Medicine)  CHIEF COMPLAINT: Focal segmental glomerulonephritis, unknown etiology.  INTERVAL HISTORY: Patient is a 75 year old male who was recently diagnosed with focal segmental glomerulonephritis of unknown etiology.  He is referred to the cancer center to rule out underlying malignancy as an etiology.  He currently feels well and is asymptomatic.  He denies any recent fevers, night sweats, or unintentional weight loss.  He denies any pain.  He has no neurologic complaints.  He has no chest pain, shortness of breath, cough, or hemoptysis.  He denies any nausea, vomiting, constipation, or diarrhea.  He has no urinary complaints.  Patient feels at his baseline and offers no specific complaints today.  REVIEW OF SYSTEMS:   Review of Systems  Constitutional: Negative.  Negative for fever, malaise/fatigue and weight loss.  Respiratory: Negative.  Negative for cough, hemoptysis and shortness of breath.   Cardiovascular: Negative.  Negative for chest pain and leg swelling.  Gastrointestinal: Negative.  Negative for abdominal pain, blood in stool and melena.  Genitourinary: Negative.  Negative for dysuria and hematuria.  Musculoskeletal: Negative.   Skin: Negative.  Negative for rash.  Neurological: Negative.  Negative for dizziness, focal weakness, weakness and headaches.  Psychiatric/Behavioral: Negative.  The patient is not nervous/anxious.     As per HPI. Otherwise, a complete review of systems is negative.  PAST MEDICAL HISTORY: Past Medical History:  Diagnosis Date  . Arthritis   . Diabetes mellitus without complication ([place])   . Heart murmur   . Hyperlipidemia   . Hypertension   . Skin cancer   . Sleep apnea     PAST SURGICAL HISTORY: Past Surgical History:   Procedure Laterality Date  . APPENDECTOMY    . CATARACT EXTRACTION    . CHOLECYSTECTOMY    . PARS PLANA VITRECTOMY W/ REPAIR OF MACULAR HOLE      FAMILY HISTORY: Family History  Problem Relation Age of Onset  . Prostate cancer Neg Hx   . Bladder Cancer Neg Hx   . Kidney cancer Neg Hx     ADVANCED DIRECTIVES (Y/N):  N  HEALTH MAINTENANCE: Social History   Tobacco Use  . Smoking status: Never Smoker  . Smokeless tobacco: Never Used  Substance Use Topics  . Alcohol use: No  . Drug use: No     Colonoscopy:  PAP:  Bone density:  Lipid panel:  No Known Allergies  Current Outpatient Medications  Medication Sig Dispense Refill  . amLODipine (NORVASC) 10 MG tablet TAKE ONE TABLET BY MOUTH ONCE DAILY    . dapagliflozin propanediol (FARXIGA) 10 MG TABS tablet Take 10 mg by mouth 1 day or 1 dose.    . insulin glargine (LANTUS) 100 UNIT/ML Solostar Pen INJECT 50 UNITS SUBCUTANEOUSLY ONCE DAILY    . insulin lispro (HUMALOG) 100 UNIT/ML KiwkPen INJECT 15 UNITS SUBCUTANEOUSLY THREE TIMES DAILY WITH MEALS    . losartan (COZAAR) 100 MG tablet Take 100 mg by mouth 1 day or 1 dose.    . rosuvastatin (CRESTOR) 20 MG tablet Take by mouth.    . spironolactone (ALDACTONE) 25 MG tablet Take 25 mg by mouth 1 day or 1 dose.    . tamsulosin (FLOMAX) 0.4 MG CAPS capsule Take by mouth.    . Alprostadil, Vasodilator, (MUSE) 500 MCG PLLT Place 1 each into the  urethra as needed. 30 each 0  . metFORMIN (GLUCOPHAGE) 1000 MG tablet TAKE ONE TABLET BY MOUTH TWICE DAILY WITH MEALS    . torsemide (DEMADEX) 10 MG tablet Take 10 mg by mouth daily.     No current facility-administered medications for this visit.    OBJECTIVE: Vitals:   10/01/20 0936  BP: (!) 144/53  Pulse: 65  Temp: 97.9 F (36.6 C)  SpO2: 98%     Body mass index is 38.87 kg/m.    ECOG FS:0 - Asymptomatic  General: Well-developed, well-nourished, no acute distress. Eyes: Pink conjunctiva, anicteric sclera. HEENT:  Normocephalic, moist mucous membranes. Lungs: No audible wheezing or coughing. Heart: Regular rate and rhythm. Abdomen: Soft, nontender, no obvious distention. Musculoskeletal: No edema, cyanosis, or clubbing. Neuro: Alert, answering all questions appropriately. Cranial nerves grossly intact. Skin: No rashes or petechiae noted. Psych: Normal affect. Lymphatics: No cervical, calvicular, axillary or inguinal LAD.   LAB RESULTS:  Lab Results  Component Value Date   CREATININE 1.70 (H) 08/09/2019   GFRNONAA 39 (L) 07/05/2019   GFRAA 45 (L) 07/05/2019    Lab Results  Component Value Date   WBC 8.2 09/03/2020   HGB 10.4 (L) 09/03/2020   HCT 30.8 (L) 09/03/2020   MCV 91.7 09/03/2020   PLT 255 09/03/2020     STUDIES: US BIOPSY (KIDNEY)  Result Date: 09/03/2020 INDICATION: Persistent proteinuria and nephrotic syndrome. EXAM: ULTRASOUND GUIDED CORE BIOPSY OF right kidney MEDICATIONS: None. ANESTHESIA/SEDATION: Fentanyl 100 mcg IV; Versed 2.0 mg IV Moderate Sedation Time:  20 minutes. The patient was continuously monitored during the procedure by the interventional radiology nurse under my direct supervision. PROCEDURE: The procedure, risks, benefits, and alternatives were explained to the patient. Questions regarding the procedure were encouraged and answered. The patient understands and consents to the procedure. A time-out was performed prior to initiating the procedure. Both kidneys were examined by ultrasound in a prone position. The right flank region was prepped with chlorhexidine in a sterile fashion, and a sterile drape was applied covering the operative field. A sterile gown and sterile gloves were used for the procedure. Local anesthesia was provided with 1% Lidocaine. A 15 gauge trocar needle was advanced to the margin of lower pole cortex of the right kidney under ultrasound guidance. After confirming needle tip position, 2 separate coaxial 16 gauge core biopsy samples were obtained  and placed on saline soaked Telfa gauze. A slurry of Gel-Foam pledgets mixed in sterile saline was then injected through the outer needle as the needle was retracted and removed. Additional ultrasound was performed. COMPLICATIONS: None immediate. FINDINGS: Both kidneys are visible by ultrasound with the right slightly better visualized and in a more superficial position. Solid core biopsy samples were obtained from the right kidney. IMPRESSION: Ultrasound-guided core biopsy performed of the right kidney at the level of lower pole cortex. Electronically Signed   By: Aletta Edouard M.D.   On: 09/03/2020 10:45    ASSESSMENT: Focal segmental glomerulonephritis, unknown etiology.  PLAN:    1. Focal segmental glomerulonephritis, unknown etiology: Rarely, FSGS can be paraneoplastic and related to underlying malignancy, particularly lymphoma.  Laboratory work from today is pending at time of dictation.  We will also get a noncontrast CT of the chest, abdomen, and pelvis for completeness.  Patient will have video assisted telemedicine visit in 2 to 3 weeks to discuss his imaging and laboratory results. 2.  Rising PSA: Patient reports his PSA has increased from approximately 4-6.  We will repeat today.  He has been instructed to keep his previously scheduled follow-up appointment with urology. 3.  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  I spent a total of 45 minutes reviewing chart data, face-to-face evaluation with the patient, counseling and coordination of care as detailed above.  Patient expressed understanding and was in agreement with this plan. He also understands that He can call clinic at any time with any questions, concerns, or complaints.   Cancer Staging No matching staging information was found for the patient.  Lloyd Huger, MD   10/01/2020 10:37 AM

## 2020-10-02 LAB — PROTEIN ELECTROPHORESIS, SERUM
A/G Ratio: 1.3 (ref 0.7–1.7)
Albumin ELP: 3.9 g/dL (ref 2.9–4.4)
Alpha-1-Globulin: 0.2 g/dL (ref 0.0–0.4)
Alpha-2-Globulin: 0.8 g/dL (ref 0.4–1.0)
Beta Globulin: 0.9 g/dL (ref 0.7–1.3)
Gamma Globulin: 1.1 g/dL (ref 0.4–1.8)
Globulin, Total: 3 g/dL (ref 2.2–3.9)
Total Protein ELP: 6.9 g/dL (ref 6.0–8.5)

## 2020-10-05 LAB — COMP PANEL: LEUKEMIA/LYMPHOMA

## 2020-10-14 ENCOUNTER — Ambulatory Visit
Admission: RE | Admit: 2020-10-14 | Discharge: 2020-10-14 | Disposition: A | Discharge status: Home / Self Care | Payer: Medicare Other | Source: Ambulatory Visit | Attending: Oncology | Admitting: Oncology

## 2020-10-14 ENCOUNTER — Other Ambulatory Visit: Payer: Self-pay

## 2020-10-14 DIAGNOSIS — I700 Atherosclerosis of aorta: Secondary | ICD-10-CM | POA: Diagnosis not present

## 2020-10-14 DIAGNOSIS — N051 Unspecified nephritic syndrome with focal and segmental glomerular lesions: Secondary | ICD-10-CM | POA: Insufficient documentation

## 2020-10-14 DIAGNOSIS — N401 Enlarged prostate with lower urinary tract symptoms: Secondary | ICD-10-CM | POA: Diagnosis not present

## 2020-10-21 ENCOUNTER — Inpatient Hospital Stay: Payer: Medicare Other | Admitting: Oncology

## 2020-10-22 ENCOUNTER — Inpatient Hospital Stay: Payer: Medicare Other | Admitting: Oncology

## 2020-10-22 ENCOUNTER — Other Ambulatory Visit: Payer: Self-pay

## 2020-10-22 VITALS — BP 130/50 | HR 65 | Temp 98.0°F | Ht 70.0 in | Wt 271.3 lb

## 2020-10-22 DIAGNOSIS — N051 Unspecified nephritic syndrome with focal and segmental glomerular lesions: Secondary | ICD-10-CM

## 2020-10-22 NOTE — Progress Notes (Signed)
[facility]  [phone] [phone]  ID: Alvin Thompson OB: [birth year]  MR#: [id number]  GBT#:[id number]  Patient Care Team: Kirk Ruths, MD as PCP - General (Internal Medicine)  CHIEF COMPLAINT: Focal segmental glomerulonephritis, unknown etiology.  INTERVAL HISTORY: Patient returns to clinic today for further evaluation and discussion of his laboratory and imaging results.  He continues to feel well and remains asymptomatic. He denies any recent fevers, night sweats, or unintentional weight loss.  He denies any pain.  He has no neurologic complaints.  He has no chest pain, shortness of breath, cough, or hemoptysis.  He denies any nausea, vomiting, constipation, or diarrhea.  He has no urinary complaints.  Patient offers no specific complaints today.  REVIEW OF SYSTEMS:   Review of Systems  Constitutional: Negative.  Negative for fever, malaise/fatigue and weight loss.  Respiratory: Negative.  Negative for cough, hemoptysis and shortness of breath.   Cardiovascular: Negative.  Negative for chest pain and leg swelling.  Gastrointestinal: Negative.  Negative for abdominal pain, blood in stool and melena.  Genitourinary: Negative.  Negative for dysuria and hematuria.  Musculoskeletal: Negative.   Skin: Negative.  Negative for rash.  Neurological: Negative.  Negative for dizziness, focal weakness, weakness and headaches.  Psychiatric/Behavioral: Negative.  The patient is not nervous/anxious.    As per HPI. Otherwise, a complete review of systems is negative.  PAST MEDICAL HISTORY: Past Medical History:  Diagnosis Date   Arthritis    Diabetes mellitus without complication (HCC)    Heart murmur    Hyperlipidemia    Hypertension    Skin cancer    Sleep apnea     PAST SURGICAL HISTORY: Past Surgical History:  Procedure Laterality Date   APPENDECTOMY     CATARACT EXTRACTION     CHOLECYSTECTOMY     PARS PLANA VITRECTOMY W/  REPAIR OF MACULAR HOLE      FAMILY HISTORY: Family History  Problem Relation Age of Onset   Prostate cancer Neg Hx    Bladder Cancer Neg Hx    Kidney cancer Neg Hx     ADVANCED DIRECTIVES (Y/N):  N  HEALTH MAINTENANCE: Social History   Tobacco Use   Smoking status: Never   Smokeless tobacco: Never  Substance Use Topics   Alcohol use: No   Drug use: No     Colonoscopy:  PAP:  Bone density:  Lipid panel:  No Known Allergies  Current Outpatient Medications  Medication Sig Dispense Refill   amLODipine (NORVASC) 10 MG tablet TAKE ONE TABLET BY MOUTH ONCE DAILY     dapagliflozin propanediol (FARXIGA) 10 MG TABS tablet Take 10 mg by mouth 1 day or 1 dose.     insulin glargine (LANTUS) 100 UNIT/ML Solostar Pen INJECT 50 UNITS SUBCUTANEOUSLY ONCE DAILY     insulin lispro (HUMALOG) 100 UNIT/ML KiwkPen INJECT 15 UNITS SUBCUTANEOUSLY THREE TIMES DAILY WITH MEALS     losartan (COZAAR) 100 MG tablet Take 100 mg by mouth 1 day or 1 dose.     rosuvastatin (CRESTOR) 20 MG tablet Take by mouth.     spironolactone (ALDACTONE) 25 MG tablet Take 25 mg by mouth 1 day or 1 dose.     tamsulosin (FLOMAX) 0.4 MG CAPS capsule Take by mouth.     torsemide (DEMADEX) 10 MG tablet Take 10 mg by mouth daily.     Alprostadil, Vasodilator, (MUSE) 500 MCG PLLT Place 1 each into the urethra as needed. 30 each 0   metFORMIN (  GLUCOPHAGE) 1000 MG tablet TAKE ONE TABLET BY MOUTH TWICE DAILY WITH MEALS     No current facility-administered medications for this visit.    OBJECTIVE: Vitals:   10/22/20 0939  BP: (!) 130/50  Pulse: 65  Temp: 98 F (36.7 C)  SpO2: 99%     Body mass index is 38.93 kg/m.    ECOG FS:0 - Asymptomatic  General: Well-developed, well-nourished, no acute distress. Eyes: Pink conjunctiva, anicteric sclera. HEENT: Normocephalic, moist mucous membranes. Lungs: No audible wheezing or coughing. Heart: Regular rate and rhythm. Abdomen: Soft, nontender, no obvious  distention. Musculoskeletal: No edema, cyanosis, or clubbing. Neuro: Alert, answering all questions appropriately. Cranial nerves grossly intact. Skin: No rashes or petechiae noted. Psych: Normal affect.   LAB RESULTS:  Lab Results  Component Value Date   NA 137 10/01/2020   K 4.9 10/01/2020   CL 102 10/01/2020   CO2 24 10/01/2020   GLUCOSE 303 (H) 10/01/2020   BUN 33 (H) 10/01/2020   CREATININE 2.03 (H) 10/01/2020   CALCIUM 9.0 10/01/2020   PROT 7.1 10/01/2020   ALBUMIN 4.0 10/01/2020   AST 19 10/01/2020   ALT 19 10/01/2020   ALKPHOS 57 10/01/2020   BILITOT 0.9 10/01/2020   GFRNONAA 34 (L) 10/01/2020   GFRAA 45 (L) 07/05/2019    Lab Results  Component Value Date   WBC 8.1 10/01/2020   NEUTROABS 5.7 10/01/2020   HGB 10.9 (L) 10/01/2020   HCT 32.2 (L) 10/01/2020   MCV 93.3 10/01/2020   PLT 250 10/01/2020     STUDIES: CT CHEST ABDOMEN PELVIS WO CONTRAST  Result Date: 10/14/2020 CLINICAL DATA:  FSGS, 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 Additional oral enteric contrast was administered. COMPARISON:  CT abdomen pelvis, 08/09/2019 FINDINGS: CT CHEST FINDINGS Cardiovascular: Aortic atherosclerosis. Mild cardiomegaly. Three-vessel coronary artery calcifications. No pericardial effusion. Mediastinum/Nodes: No enlarged mediastinal, hilar, or axillary lymph nodes. Thyroid gland, trachea, and esophagus demonstrate no significant findings. Lungs/Pleura: Lungs are clear. No pleural effusion or pneumothorax. Musculoskeletal: No chest wall mass or suspicious bone lesions identified. CT ABDOMEN PELVIS FINDINGS Hepatobiliary: No focal liver abnormality is seen. Status post cholecystectomy. No biliary dilatation. Pancreas: Unremarkable. No pancreatic ductal dilatation or surrounding inflammatory changes. Spleen: Normal  in size without significant abnormality. Adrenals/Urinary Tract: Adrenal glands are unremarkable. Simple cyst of the superior pole of the right kidney (series 2, image 60). Kidneys are otherwise normal, without renal calculi, solid lesion, or hydronephrosis. Bladder is unremarkable. Stomach/Bowel: Stomach is within normal limits. Appendix is not clearly visualized. No evidence of bowel wall thickening, distention, or inflammatory changes. Sigmoid diverticulosis. Vascular/Lymphatic: Aortic atherosclerosis. No enlarged abdominal or pelvic lymph nodes. Reproductive: Prostatomegaly. Other: No abdominal wall hernia or abnormality. No abdominopelvic ascites. Musculoskeletal: No acute or significant osseous findings. IMPRESSION: 1. No noncontrast evidence of mass, lymphadenopathy, or metastatic disease in the chest, abdomen, or pelvis. 2. Prostatomegaly. 3. Sigmoid diverticulosis without evidence of acute diverticulitis. 4. Coronary artery disease. Aortic Atherosclerosis (ICD10-I70.0). Electronically Signed   By: Eddie Candle M.D.   On: 10/14/2020 13:50     ASSESSMENT: Focal segmental glomerulonephritis, unknown etiology.  PLAN:    1. Focal segmental glomerulonephritis, unknown etiology: Rarely, FSGS can be paraneoplastic and related to underlying malignancy, particularly lymphoma.  All of patient's laboratory work is either negative or within normal limits.  He was noted to have an abnormal  cell population on flow cytometry of less than 1%, but the clinical significance of this is unclear.  CT scan results from October 14, 2020 reviewed independently and reported as above with no obvious evidence of malignancy.  No intervention is needed at this time.  No further follow-up has been scheduled.  2.  Rising PSA: Patient previously reported his PSA was increasing and his most recent result was 6.06. 3.  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  Patient's most recent creatinine is 2.03. 4.  Anemia: Mild,  possibly related to underlying renal insufficiency.  Monitor. 5.  Hyperglycemia: Patient currently on insulin and metformin.  Continue follow-up with primary care.   Patient expressed understanding and was in agreement with this plan. He also understands that He can call clinic at any time with any questions, concerns, or complaints.   Cancer Staging No matching staging information was found for the patient.  Lloyd Huger, MD   10/24/2020 1:51 PM

## 2021-03-08 ENCOUNTER — Other Ambulatory Visit: Payer: Self-pay

## 2021-03-08 DIAGNOSIS — R972 Elevated prostate specific antigen [PSA]: Secondary | ICD-10-CM

## 2021-03-08 DIAGNOSIS — N138 Other obstructive and reflux uropathy: Secondary | ICD-10-CM

## 2021-03-09 ENCOUNTER — Other Ambulatory Visit: Payer: Self-pay

## 2021-03-09 ENCOUNTER — Other Ambulatory Visit: Payer: Medicare Other

## 2021-03-09 DIAGNOSIS — R972 Elevated prostate specific antigen [PSA]: Secondary | ICD-10-CM

## 2021-03-09 DIAGNOSIS — N138 Other obstructive and reflux uropathy: Secondary | ICD-10-CM

## 2021-03-10 LAB — PSA: Prostate Specific Ag, Serum: 7.2 ng/mL — ABNORMAL HIGH (ref 0.0–4.0)

## 2021-03-16 NOTE — Progress Notes (Signed)
03/17/21 11:56 AM   Alvin Thompson [birth year] [id number]  Referring provider:  Kirk Ruths, MD [facility] [place],  [place] [id number]  Chief Complaint  Patient presents with   Elevated PSA    Urological history: Elevated PSA  -PSA trend  Component Prostate Specific Ag, Serum  Latest Ref Rng & Units 0.0 - 4.0 ng/mL  11/09/2016 3.5  05/04/2017 3.8  05/21/2018 4.0  11/15/2018 4.8 (H)  09/10/2020 6.0 (H)  03/09/2021 7.2 (H)   2. BPH with LUTS  - IPS'S 19/4 - Managed with tamsulosin 0.4 mg daily  3. High risk hematuria  - Non-smoker  - CTU 08/2019 showed 2.3 cm cyst in the upper pole right kidney. No urinary stones, decompressed ureters, and relatively poor opacification of the intrarenal collecting systems and ureters bilaterally.  -cysto 08/2019 Enlarged prostate w/ no median lobe.  Bilopar coaptation and little blood upon manipulation w/ instrumentation.  Moderate trabeculation w/ thickening fibers  -underwent renal biopsy on 09/03/2020 for persistent proteinuria and nephrotic syndrome - pathology pending at the time of this visit   4. Phimosis  5. UTI -occurrence 2019   6. ED -contributing factors of age, sleep apnea, HTN, diabetes, CKD and HLD -failed PDE5i's   HPI: Alvin Thompson is a 75 y.o.male with a personal history of elevated PSA, BPH with LUTS, high risk hematuria, and phimosis who returns today for discussion on elevated PSA.   He reports he has issues with frequency, urgency, and weak urinary stream. He states this has been ongoing for awhile.   Recent imaging revealed a prostate of 200 cc in size.    IPSS     Row Name 03/17/21 1100         International Prostate Symptom Score   How often have you had the sensation of not emptying your bladder? Less than half the time     How often have you had to urinate less than every two hours? About half the time     How often have you found you stopped and started  again several times when you urinated? About half the time     How often have you found it difficult to postpone urination? About half the time     How often have you had a weak urinary stream? More than half the time     How often have you had to strain to start urination? Not at All     How many times did you typically get up at night to urinate? 4 Times     Total IPSS Score 19       Quality of Life due to urinary symptoms   If you were to spend the rest of your life with your urinary condition just the way it is now how would you feel about that? Mostly Disatisfied              Score:  1-7 Mild 8-19 Moderate 20-35 Severe  PMH: Past Medical History:  Diagnosis Date   Arthritis    Diabetes mellitus without complication (HCC)    Heart murmur    Hyperlipidemia    Hypertension    Skin cancer    Sleep apnea     Surgical History: Past Surgical History:  Procedure Laterality Date   APPENDECTOMY     CATARACT EXTRACTION     CHOLECYSTECTOMY     PARS PLANA VITRECTOMY W/ REPAIR OF MACULAR HOLE  Home Medications:  Allergies as of 03/17/2021   No Known Allergies      Medication List        Accurate as of March 17, 2021 11:56 AM. If you have any questions, ask your nurse or doctor.          STOP taking these medications    metFORMIN 1000 MG tablet Commonly known as: GLUCOPHAGE Stopped by: Kaliopi Blyden, PA-C   Muse 500 MCG Pllt Generic drug: Alprostadil (Vasodilator) Stopped by: Zara Council, PA-C       TAKE these medications    amLODipine 10 MG tablet Commonly known as: NORVASC TAKE ONE TABLET BY MOUTH ONCE DAILY   dapagliflozin propanediol 10 MG Tabs tablet Commonly known as: FARXIGA Take 10 mg by mouth 1 day or 1 dose.   insulin glargine 100 UNIT/ML Solostar Pen Commonly known as: LANTUS INJECT 50 UNITS SUBCUTANEOUSLY ONCE DAILY   insulin lispro 100 UNIT/ML KiwkPen Commonly known as: HUMALOG INJECT 15 UNITS SUBCUTANEOUSLY THREE  TIMES DAILY WITH MEALS   losartan 100 MG tablet Commonly known as: COZAAR Take 100 mg by mouth 1 day or 1 dose.   rosuvastatin 20 MG tablet Commonly known as: CRESTOR Take by mouth.   spironolactone 25 MG tablet Commonly known as: ALDACTONE Take 25 mg by mouth 1 day or 1 dose.   tamsulosin 0.4 MG Caps capsule Commonly known as: FLOMAX Take by mouth.   torsemide 10 MG tablet Commonly known as: DEMADEX Take 10 mg by mouth daily.        Allergies:  No Known Allergies  Family History: Family History  Problem Relation Age of Onset   Prostate cancer Neg Hx    Bladder Cancer Neg Hx    Kidney cancer Neg Hx     Social History:  reports that he has never smoked. He has never used smokeless tobacco. He reports that he does not drink alcohol and does not use drugs.   Physical Exam: BP 117/69   Pulse 76   Ht _0  (1.778 m)   Wt 271 lb (122.9 kg)   BMI 38.88 kg/m   Constitutional:  Alert and oriented, No acute distress. HEENT: [place] AT, mask in place.  Trachea midline Cardiovascular: No clubbing, cyanosis, or edema. Respiratory: Normal respiratory effort, no increased work of breathing. Neurologic: Grossly intact, no focal deficits, moving all 4 extremities. Psychiatric: Normal mood and affect.  Laboratory Data: Lab Results  Component Value Date   CREATININE 2.03 (H) 10/01/2020   Component Prostate Specific Ag, Serum  Latest Ref Rng & Units 0.0 - 4.0 ng/mL  11/09/2016 3.5  05/04/2017 3.8  05/21/2018 4.0  11/15/2018 4.8 (H)  09/10/2020 6.0 (H)  03/09/2021 7.2 (H)   Hemoglobin A1C 4.2 - 5.6 % 8.5 High    Average Blood Glucose (Calc) mg/dL [facility] - [facility]  Narrative Performed by [facility] - [facility] Normal Range:    4.2 - 5.6%  Increased Risk:  5.7 - 6.4%  Diabetes:        >= 6.5%  Glycemic Control for adults with diabetes:  <7%   Specimen Collected: 03/09/21 08:08 Last Resulted: 03/09/21 10:08  Received From: [facility]  Result Received: 03/10/21 13:59    Ref Range & Units 7 d ago  Creatinine, Random Urine 40.0 - 300.0 mg/dL 87.1   Urine Albumin, Random mg/L 842   Urine Albumin/Creatinine Ratio <30.0 ug/mg 966.7 High    Comment:  Urine:         Spot collection               (g/mg creatinine)      Normal               < 30    Moderately          30-299          increased    Clinical             >=300  albuminuria  Resulting Agency  [facility] - [facility]  Specimen Collected: 03/09/21 08:08 Last Resulted: 03/09/21 17:38  Received From: [facility]  Result Received: 03/10/21 13:59    Ref Range & Units 7 d ago  Cholesterol, Total 100 - 200 mg/dL 90 Low    Triglyceride 35 - 199 mg/dL 223 High    HDL (High Density Lipoprotein) Cholesterol 29.0 - 71.0 mg/dL 21.8 Low    LDL Calculated 0 - 130 mg/dL 24   VLDL Cholesterol mg/dL 45   Cholesterol/HDL Ratio  4.1   Resulting [facility] - [facility]  Specimen Collected: 03/09/21 08:08 Last Resulted: 03/09/21 13:22  Received From: [facility]  Result Received: 03/10/21 13:59   Ref Range & Units 7 d ago   Glucose 70 - 110 mg/dL 124 High    Sodium 136 - 145 mmol/L 137   Potassium 3.6 - 5.1 mmol/L 5.1   Chloride 97 - 109 mmol/L 106   Carbon Dioxide (CO2) 22.0 - 32.0 mmol/L 26.0   Calcium 8.7 - 10.3 mg/dL 9.2   Urea Nitrogen (BUN) 7 - 25 mg/dL 39 High    Creatinine 0.7 - 1.3 mg/dL 2.3 High    Glomerular Filtration Rate (eGFR), MDRD Estimate >60 mL/min/1.73sq m 28 Low    BUN/Crea Ratio 6.0 - 20.0 17.0   Anion Gap w/K 6.0 - 16.0 10.1   Resulting Agency  [facility] - [facility]  Specimen Collected: 03/09/21 08:08 Last Resulted: 03/09/21 12:31  Received From: [facility]  Result Received: 03/10/21 13:59     Assessment & Plan:    Elevated PSA  - PSA risen to 7.2  - We reviewed the implications of an elevated PSA and the uncertainty surrounding it. In  general, a man's PSA increases with age and is produced by both normal and cancerous prostate tissue. The differential diagnosis for elevated PSA includes BPH, prostate cancer, infection, recent intercourse/ejaculation, recent urethroscopic manipulation (foley placement/cystoscopy) or trauma, and prostatitis.  -Management of an elevated PSA can include observation, prostate MRI or prostate biopsy and we discussed this in detail. Our goal is to detect clinically significant prostate cancers, and manage with either active surveillance, surgery, or radiation for localized disease. Risks of prostate biopsy include bleeding, infection (including life threatening sepsis), pain, and lower urinary symptoms. Hematuria, hematospermia, and blood in the stool are all common after biopsy and can persist up to 4 weeks.   - Patient desires prostate MRI scheduled for further stratification   2. BPH with LUTS  - will address further once MRI results are available     Return in 2 weeks for results.   [facility] [address] [place], [place] [address] [phone]  I,Kailey Littlejohn,acting as a scribe for [facility], PA-C.,have documented all relevant documentation on the behalf of Daton Szilagyi, PA-C,as directed by  [facility], PA-C while in the presence of [place],  PA-C.

## 2021-03-17 ENCOUNTER — Ambulatory Visit (INDEPENDENT_AMBULATORY_CARE_PROVIDER_SITE_OTHER): Payer: Medicare Other | Admitting: Urology

## 2021-03-17 ENCOUNTER — Other Ambulatory Visit: Payer: Self-pay

## 2021-03-17 ENCOUNTER — Encounter: Payer: Self-pay | Admitting: Urology

## 2021-03-17 VITALS — BP 117/69 | HR 76 | Ht 70.0 in | Wt 271.0 lb

## 2021-03-17 DIAGNOSIS — N401 Enlarged prostate with lower urinary tract symptoms: Secondary | ICD-10-CM

## 2021-03-17 DIAGNOSIS — N138 Other obstructive and reflux uropathy: Secondary | ICD-10-CM | POA: Diagnosis not present

## 2021-03-17 DIAGNOSIS — R972 Elevated prostate specific antigen [PSA]: Secondary | ICD-10-CM | POA: Diagnosis not present

## 2021-03-28 ENCOUNTER — Ambulatory Visit
Admission: RE | Admit: 2021-03-28 | Discharge: 2021-03-28 | Disposition: A | Discharge status: Home / Self Care | Payer: Medicare Other | Source: Ambulatory Visit | Attending: Urology | Admitting: Urology

## 2021-03-28 ENCOUNTER — Other Ambulatory Visit: Payer: Self-pay

## 2021-03-28 DIAGNOSIS — R972 Elevated prostate specific antigen [PSA]: Secondary | ICD-10-CM | POA: Insufficient documentation

## 2021-03-28 DIAGNOSIS — N138 Other obstructive and reflux uropathy: Secondary | ICD-10-CM | POA: Insufficient documentation

## 2021-03-28 DIAGNOSIS — N401 Enlarged prostate with lower urinary tract symptoms: Secondary | ICD-10-CM | POA: Diagnosis present

## 2021-03-28 MED ORDER — GADOBUTROL 1 MMOL/ML IV SOLN
10.0000 mL | Freq: Once | INTRAVENOUS | Status: AC | PRN
  Administered 2021-03-28: 11:00:00 10 mL via INTRAVENOUS

## 2021-03-31 NOTE — Progress Notes (Signed)
04/01/21 12:17 PM   Alvin Thompson [birth year] [id number]  Referring provider:  Kirk Ruths, MD [facility] [place],  [place] [address]  Chief Complaint  Patient presents with   Elevated PSA    Urological history: Elevated PSA  -PSA trend  Component Prostate Specific Ag, Serum  Latest Ref Rng & Units 0.0 - 4.0 ng/mL  11/09/2016 3.5  05/04/2017 3.8  05/21/2018 4.0  11/15/2018 4.8 (H)  09/10/2020 6.0 (H)  03/09/2021 7.2 (H)   2. BPH with LUTS  - IPS'S 19/4 - Managed with tamsulosin 0.4 mg daily  3. High risk hematuria  - Non-smoker  - CTU 08/2019 showed 2.3 cm cyst in the upper pole right kidney. No urinary stones, decompressed ureters, and relatively poor opacification of the intrarenal collecting systems and ureters bilaterally.  -cysto 08/2019 Enlarged prostate w/ no median lobe.  Bilopar coaptation and little blood upon manipulation w/ instrumentation.  Moderate trabeculation w/ thickening fibers  -underwent renal biopsy on 09/03/2020 for persistent proteinuria and nephrotic syndrome - pathology pending at the time of this visit   4. Phimosis  5. UTI -occurrence 2019   6. ED -contributing factors of age, sleep apnea, HTN, diabetes, CKD and HLD -failed PDE5i's   HPI: Alvin Thompson is a 75 y.o.male who presents today for a prostate MRI results.  Prostate MRI 03/2021 - Single PI-RADS category 3 lesion of the left anterior transition zone and single PI-RADS category 3 lesion of the right posterolateral peripheral zone. Targeting data sent to [place].  Substantial Prostatomegaly with benign prostatic hypertrophy.   Hazy low T2 signal stranding throughout much of the peripheral zone, likely postinflammatory.  He continues to have significant lower urinary tract symptoms of frequency, urgency, incontinence, difficulty urinating and a weak urinary stream.  Patient denies any modifying or aggravating factors.  Patient denies any  gross hematuria, dysuria or suprapubic/flank pain.  Patient denies any fevers, chills, nausea or vomiting.     PMH: Past Medical History:  Diagnosis Date   Arthritis    Diabetes mellitus without complication ([place])    Heart murmur    Hyperlipidemia    Hypertension    Skin cancer    Sleep apnea     Surgical History: Past Surgical History:  Procedure Laterality Date   APPENDECTOMY     CATARACT EXTRACTION     CHOLECYSTECTOMY     PARS PLANA VITRECTOMY W/ REPAIR OF MACULAR HOLE      Home Medications:  Allergies as of 04/01/2021   No Known Allergies      Medication List        Accurate as of April 01, 2021 12:17 PM. If you have any questions, ask your nurse or doctor.          amLODipine 10 MG tablet Commonly known as: NORVASC TAKE ONE TABLET BY MOUTH ONCE DAILY   dapagliflozin propanediol 10 MG Tabs tablet Commonly known as: FARXIGA Take 10 mg by mouth 1 day or 1 dose.   finasteride 5 MG tablet Commonly known as: Proscar Take 1 tablet (5 mg total) by mouth daily. Started by: Zara Council, PA-C   insulin glargine 100 UNIT/ML Solostar Pen Commonly known as: LANTUS INJECT 50 UNITS SUBCUTANEOUSLY ONCE DAILY   insulin lispro 100 UNIT/ML KiwkPen Commonly known as: HUMALOG INJECT 15 UNITS SUBCUTANEOUSLY THREE TIMES DAILY WITH MEALS   losartan 100 MG tablet Commonly known as: COZAAR Take 100 mg by mouth 1 day or 1 dose.  rosuvastatin 20 MG tablet Commonly known as: CRESTOR Take by mouth.   spironolactone 25 MG tablet Commonly known as: ALDACTONE Take 25 mg by mouth 1 day or 1 dose.   tamsulosin 0.4 MG Caps capsule Commonly known as: FLOMAX Take by mouth.   torsemide 10 MG tablet Commonly known as: DEMADEX Take 10 mg by mouth daily.        Allergies:  No Known Allergies  Family History: Family History  Problem Relation Age of Onset   Prostate cancer Neg Hx    Bladder Cancer Neg Hx    Kidney cancer Neg Hx     Social History:   reports that he has never smoked. He has never used smokeless tobacco. He reports that he does not drink alcohol and does not use drugs.   Physical Exam: BP (!) 155/73   Pulse 80   Ht 5\' 10"  (1.778 m)   Wt 271 lb (122.9 kg)   BMI 38.88 kg/m   Constitutional:  Well nourished. Alert and oriented, No acute distress. HEENT: [place] AT, mask in place.  Trachea midline Cardiovascular: No clubbing, cyanosis, or edema. Respiratory: Normal respiratory effort, no increased work of breathing. Neurologic: Grossly intact, no focal deficits, moving all 4 extremities. Psychiatric: Normal mood and affect.   Laboratory Data: N/A  Pertinent Imaging CLINICAL DATA:  Prostatomegaly and elevated PSA level.   EXAM: MR PROSTATE WITHOUT AND WITH CONTRAST   TECHNIQUE: Multiplanar multisequence MRI images were obtained of the pelvis centered about the prostate. Pre and post contrast images were obtained.   CONTRAST:  48mL GADAVIST GADOBUTROL 1 MMOL/ML IV SOLN   COMPARISON:  CT pelvis 10/14/2020   FINDINGS: Prostate: Encapsulated nodularity in the transition zone compatible with benign prostatic hypertrophy.   Hazy low T2 signal stranding throughout much of the peripheral zone, likely postinflammatory and considered PI-RADS category 2.   Region of interest # 1: PI-RADS category 3 lesion of the left anterior transition zone in the mid gland with generally well-defined margins, T2 hypointensity, and restriction of diffusion. This measures 5.19 cc (2.9 by 2.5 by 1.7 cm) and is shown for example on image 49 of series 9.   Region of interest # 2: PI-RADS category 3 lesion of the right posterolateral peripheral zone in the mid gland with low T2 signal but no focal early enhancement or substantial restriction of diffusion. This measures 0.87 cc (1.6 by 0.6 by 1.6 cm) and is shown for example on image 53 series 9.   Volume: 3D volumetric analysis: Prostate volume 123.38 cc (6.4 by 6.1 by 6.4 cm).    Transcapsular spread:  Absent   Seminal vesicle involvement: Absent   Neurovascular bundle involvement: Absent   Pelvic adenopathy: Absent   Bone metastasis: Absent   Other findings: Sigmoid colon diverticulosis. Small lipoma of the left tensor fascia lata muscle. Degenerative subcortical cyst formation in the acetabular roof bilaterally.   IMPRESSION: 1. Single PI-RADS category 3 lesion of the left anterior transition zone and single PI-RADS category 3 lesion of the right posterolateral peripheral zone. Targeting data sent to [place]. 2. Substantial Prostatomegaly with benign prostatic hypertrophy. 3. Hazy low T2 signal stranding throughout much of the peripheral zone, likely postinflammatory. 4. Sigmoid colon diverticulosis.     Electronically Signed   By: Van Clines M.D.   On: 03/29/2021 08:36 I have independently reviewed the films.  See HPI.      Assessment & Plan:    Elevated PSA  -Discussed the findings with the patient.  Explained that there is still controversy regarding the management of PI-RAD 3 lesions.  12-33% of men with these lesions will have prostate cancer and the [place] calculator notes a 23% probability of finding high risk prostate cancer on biopsy.    -Discussed the options of follow up of continued monitoring, starting finasteride to address symptoms and decrease prostate size or a prostate biopsy -he would like to start finasteride 5 mg daily and he will return in three month for repeat PSA and I PSS  2. BPH with LUTS -PSA elevated -prostate MRI with PI-RADS lesion -symptoms - frequency, urgency, incontinence, difficulty urinating and a weak urinary stream -Initiate 5 alpha reductase inhibitor (finasteride 5 mg daily)-script sent to pharmacy  -Continue tamsulosin 0.4 mg daily -refills given  Return in about 3 months (around 06/30/2021) for IPSS, PSA and PVR .    [facility] [address], [place],  [address] [phone]

## 2021-04-01 ENCOUNTER — Encounter: Payer: Self-pay | Admitting: Urology

## 2021-04-01 ENCOUNTER — Ambulatory Visit (INDEPENDENT_AMBULATORY_CARE_PROVIDER_SITE_OTHER): Payer: Medicare Other | Admitting: Urology

## 2021-04-01 ENCOUNTER — Other Ambulatory Visit: Payer: Self-pay

## 2021-04-01 VITALS — BP 155/73 | HR 80 | Ht 70.0 in | Wt 271.0 lb

## 2021-04-01 DIAGNOSIS — N401 Enlarged prostate with lower urinary tract symptoms: Secondary | ICD-10-CM

## 2021-04-01 DIAGNOSIS — N138 Other obstructive and reflux uropathy: Secondary | ICD-10-CM

## 2021-04-01 DIAGNOSIS — R972 Elevated prostate specific antigen [PSA]: Secondary | ICD-10-CM | POA: Diagnosis not present

## 2021-04-01 MED ORDER — FINASTERIDE 5 MG PO TABS: 5.0000 mg | ORAL_TABLET | Freq: Every day | ORAL | 3 refills | Status: DC

## 2021-06-24 ENCOUNTER — Other Ambulatory Visit: Payer: Self-pay

## 2021-06-24 ENCOUNTER — Other Ambulatory Visit: Payer: Medicare Other

## 2021-06-24 DIAGNOSIS — R972 Elevated prostate specific antigen [PSA]: Secondary | ICD-10-CM

## 2021-06-25 LAB — PSA: Prostate Specific Ag, Serum: 3.5 ng/mL (ref 0.0–4.0)

## 2021-06-29 NOTE — Progress Notes (Signed)
06/30/21 ?11:44 AM  ? ?Chez Urban Gibson ?1945-05-31 ?[id number] ? ?Referring provider:  ?Kirk Ruths, MD ?[place],  [place] [address] ? ?Chief Complaint  ?Patient presents with  ? Benign Prostatic Hypertrophy  ? ?Urological history: ?Elevated PSA  ?-PSA trend  ?Component ?    Latest Ref Rng & Units 11/09/2016 05/04/2017 05/21/2018 11/15/2018  ?Prostate Specific Ag, Serum ?    0.0 - 4.0 ng/mL 3.5 3.8 4.0 4.8 (H)  ? ?Component ?    Latest Ref Rng & Units 09/10/2020 03/09/2021 06/24/2021  ?Prostate Specific Ag, Serum ?    0.0 - 4.0 ng/mL 6.0 (H) 7.2 (H) 3.5  ? ?Corrected value (7.0) ? ?2. BPH with LUTS  ?-prostate volume 123 cc on prostate MRI 03/2021 ?- I PSS 8/2 ?- Managed with tamsulosin 0.4 mg daily ? ?3. High risk hematuria  ?- Non-smoker  ?- CTU 08/2019 showed 2.3 cm cyst in the upper pole right kidney. No urinary stones, decompressed ureters, and relatively poor opacification of the intrarenal collecting systems and ureters bilaterally.  ?-cysto 08/2019 Enlarged prostate w/ no median lobe.  Bilopar coaptation and little blood upon manipulation  w/instrumentation Moderate trabeculation w/ thickening fibers  ?-underwent renal biopsy on 09/03/2020 for persistent proteinuria and nephrotic syndrome - pathology focal segmental glomerulosclerosis ?-no reports of gross heme ?-UA negative for micro heme 03/2021 ? ?4. Phimosis ? ?5. UTI ?-occurrence 2019 ? ?6. ED ?-contributing factors of age, sleep apnea, HTN, diabetes, CKD and HLD ?-failed PDE5i's  ? ?HPI: ?Alvin Thompson is a 76 y.o.male who presents today for three months for follow up. ? ?He has been taking the finasteride as prescribed and he has noted a reduction in his nocturia.  He denies any side effects with the finasteride. ? ?Patient denies any modifying or aggravating factors.  Patient denies any gross hematuria, dysuria or suprapubic/flank pain.  Patient denies any fevers, chills, nausea or vomiting.   ? ?  IPSS   ? ? [place] Name 06/30/21 1100  ?  ?  ?  ? International Prostate Symptom Score  ? How often have you had the sensation of not emptying your bladder? Less than 1 in 5    ? How often have you had to urinate less than every two hours? Less than 1 in 5 times    ? How often have you found you stopped and started again several times when you urinated? Less than half the time    ? How often have you found it difficult to postpone urination? Less than half the time    ? How often have you had a weak urinary stream? Not at All    ? How often have you had to strain to start urination? Not at All    ? How many times did you typically get up at night to urinate? 2 Times    ? Total IPSS Score 8    ?  ? Quality of Life due to urinary symptoms  ? If you were to spend the rest of your life with your urinary condition just the way it is now how would you feel about that? Mostly Satisfied    ? ?  ?  ? ?  ? ? ?Score:  ?1-7 Mild ?8-19 Moderate ?20-35 Severe  ? ?PMH: ?Past Medical History:  ?Diagnosis Date  ? Arthritis   ? Diabetes mellitus without complication ([facility])   ? Heart murmur   ? Hyperlipidemia   ?  Hypertension   ? Skin cancer   ? Sleep apnea   ? ? ?Surgical History: ?Past Surgical History:  ?Procedure Laterality Date  ? APPENDECTOMY    ? CATARACT EXTRACTION    ? CHOLECYSTECTOMY    ? PARS PLANA VITRECTOMY W/ REPAIR OF MACULAR HOLE    ? ? ?Home Medications:  ?Allergies as of 06/30/2021   ?No Known Allergies ?  ? ?  ?Medication List  ?  ? ?  ? Accurate as of June 30, 2021 11:44 AM. If you have any questions, ask your nurse or doctor.  ?  ?  ? ?  ? ?amLODipine 10 MG tablet ?Commonly known as: NORVASC ?TAKE ONE TABLET BY MOUTH ONCE DAILY ?  ?calcitRIOL 0.25 MCG capsule ?Commonly known as: ROCALTROL ?  ?dapagliflozin propanediol 10 MG Tabs tablet ?Commonly known as: FARXIGA ?Take 10 mg by mouth 1 day or 1 dose. ?  ?finasteride 5 MG tablet ?Commonly known as: Proscar ?Take 1 tablet (5 mg total) by mouth daily. ?  ?insulin glargine  100 UNIT/ML Solostar Pen ?Commonly known as: LANTUS ?INJECT 50 UNITS SUBCUTANEOUSLY ONCE DAILY ?  ?insulin lispro 100 UNIT/ML KiwkPen ?Commonly known as: HUMALOG ?INJECT 15 UNITS SUBCUTANEOUSLY THREE TIMES DAILY WITH MEALS ?  ?losartan 100 MG tablet ?Commonly known as: COZAAR ?Take 100 mg by mouth 1 day or 1 dose. ?  ?rosuvastatin 20 MG tablet ?Commonly known as: CRESTOR ?Take by mouth. ?  ?spironolactone 25 MG tablet ?Commonly known as: ALDACTONE ?Take 25 mg by mouth 1 day or 1 dose. ?  ?tamsulosin 0.4 MG Caps capsule ?Commonly known as: FLOMAX ?Take by mouth. ?  ?torsemide 10 MG tablet ?Commonly known as: DEMADEX ?Take 10 mg by mouth daily. ?  ? ?  ? ? ?Allergies:  ?No Known Allergies ? ?Family History: ?Family History  ?Problem Relation Age of Onset  ? Prostate cancer Neg Hx   ? Bladder Cancer Neg Hx   ? Kidney cancer Neg Hx   ? ? ?Social History:  reports that he has never smoked. He has never used smokeless tobacco. He reports that he does not drink alcohol and does not use drugs. ? ? ?Physical Exam: ?BP (!) 153/62   Pulse 63   Ht 5\' 10"  (1.778 m)   Wt 274 lb (124.3 kg)   BMI 39.31 kg/m?   ?Constitutional:  Well nourished. Alert and oriented, No acute distress. ?HEENT: [place] AT, mask in place  Trachea midline ?Cardiovascular: No clubbing, cyanosis, or edema. ?Respiratory: Normal respiratory effort, no increased work of breathing. ?Neurologic: Grossly intact, no focal deficits, moving all 4 extremities. ?Psychiatric: Normal mood and affect.  ? ?Laboratory Data: ?Component ?    Latest Ref Rng & Units 05/04/2017 05/21/2018 11/15/2018 09/10/2020  ?Prostate Specific Ag, Serum ?    0.0 - 4.0 ng/mL 3.8 4.0 4.8 (H) 6.0 (H)  ? ?Component ?    Latest Ref Rng & Units 03/09/2021 06/24/2021  ?Prostate Specific Ag, Serum ?    0.0 - 4.0 ng/mL 7.2 (H) 3.5  ?I have reviewed the labs.  ? ?Pertinent Imaging ?N/A   ? ? ? ?Assessment & Plan:   ? ?Elevated PSA  ?-reduced by 50% on finasteride ? ?2. BPH with LUTS ?-PSA within normal  range ?-prostate MRI with PI-RADS 3 lesion ?-Continue tamsulosin 0.4 mg daily and finasteride 5 mg daily  ? ?Return in about 6 months (around 12/31/2021) for IPSS, PSA and exam.  ?  ?[facility] ?[address]  [place], [place] [address] ?[phone] ? ? ?

## 2021-06-30 ENCOUNTER — Encounter: Payer: Self-pay | Admitting: Urology

## 2021-06-30 ENCOUNTER — Ambulatory Visit: Payer: Medicare Other | Admitting: Urology

## 2021-06-30 ENCOUNTER — Other Ambulatory Visit: Payer: Self-pay

## 2021-06-30 VITALS — BP 153/62 | HR 63 | Ht 70.0 in | Wt 274.0 lb

## 2021-06-30 DIAGNOSIS — N138 Other obstructive and reflux uropathy: Secondary | ICD-10-CM

## 2021-06-30 DIAGNOSIS — N401 Enlarged prostate with lower urinary tract symptoms: Secondary | ICD-10-CM

## 2021-06-30 DIAGNOSIS — R972 Elevated prostate specific antigen [PSA]: Secondary | ICD-10-CM

## 2021-06-30 LAB — BLADDER SCAN AMB NON-IMAGING

## 2021-09-10 ENCOUNTER — Ambulatory Visit: Payer: Self-pay | Admitting: Urology

## 2021-12-24 ENCOUNTER — Other Ambulatory Visit: Payer: Self-pay

## 2021-12-24 DIAGNOSIS — R972 Elevated prostate specific antigen [PSA]: Secondary | ICD-10-CM

## 2021-12-30 ENCOUNTER — Other Ambulatory Visit: Payer: Medicare Other

## 2021-12-30 DIAGNOSIS — R972 Elevated prostate specific antigen [PSA]: Secondary | ICD-10-CM

## 2021-12-31 LAB — PSA: Prostate Specific Ag, Serum: 2.8 ng/mL (ref 0.0–4.0)

## 2022-01-02 NOTE — Progress Notes (Unsigned)
01/04/22 11:21 AM   Alvin Thompson May 21, 1945 [id number]  Referring provider:  Kirk Ruths, MD [facility] [place],  [place] [address]  Chief Complaint  Patient presents with   Benign Prostatic Hypertrophy   Elevated PSA   Urological history: Elevated PSA  -PSA trend  Component     Latest Ref Rng & Units 11/09/2016  Prostate Specific Ag, Serum     0.0 - 4.0 ng/mL 3.5    Prostate Specific Ag, Serum  Latest Ref Rng 0.0 - 4.0 ng/mL  05/04/2017 3.8   05/21/2018 4.0   11/15/2018 4.8 (H)   09/10/2020 6.0 (H)   03/09/2021 7.2 (H)   06/24/2021 3.5   12/30/2021 2.8     Legend: (H) High  Prostate MRI (2022) - Single PI-RADS category 3 lesion of the left anterior transition zone and single PI-RADS category 3 lesion of the right posterolateral peripheral zone. Targeting data sent to [facility] [place].  Substantial Prostatomegaly with benign prostatic hypertrophy.  Hazy low T2 signal stranding throughout much of the peripheral zone, likely postinflammatory  PSA density 0.045  2. BPH with LUTS  -prostate volume 123 cc on prostate MRI 03/2021 - I PSS 4/2 - tamsulosin 0.4 mg daily  3. High risk hematuria  - Non-smoker  - CTU 08/2019 showed 2.3 cm cyst in the upper pole right kidney. No urinary stones, decompressed ureters, and relatively poor opacification of the intrarenal collecting systems and ureters bilaterally.  -cysto 08/2019 Enlarged prostate w/ no median lobe.  Bilopar coaptation and little blood upon manipulation  w/instrumentation Moderate trabeculation w/ thickening fibers  -underwent renal biopsy on 09/03/2020 for persistent proteinuria and nephrotic syndrome - pathology focal segmental glomerulosclerosis -no reports of gross heme -UA negative for micro heme  4. Phimosis  5. UTI -occurrence 2019  6. ED -contributing factors of age, sleep apnea, HTN, diabetes, CKD and HLD -failed PDE5i's   HPI: Alvin Thompson is a 76 y.o.male who  presents today for 6 month follow up.   He has no urinary complaints at this time.  Patient denies any modifying or aggravating factors.  Patient denies any gross hematuria, dysuria or suprapubic/flank pain.  Patient denies any fevers, chills, nausea or vomiting.     IPSS     Row Name 01/04/22 1000         International Prostate Symptom Score   How often have you had the sensation of not emptying your bladder? Less than 1 in 5     How often have you had to urinate less than every two hours? Not at All     How often have you found you stopped and started again several times when you urinated? Less than 1 in 5 times     How often have you found it difficult to postpone urination? Not at All     How often have you had a weak urinary stream? Not at All     How often have you had to strain to start urination? Not at All     How many times did you typically get up at night to urinate? 2 Times     Total IPSS Score 4       Quality of Life due to urinary symptoms   If you were to spend the rest of your life with your urinary condition just the way it is now how would you feel about that? Mostly Satisfied  Score:  1-7 Mild 8-19 Moderate 20-35 Severe   PMH: Past Medical History:  Diagnosis Date   Arthritis    Diabetes mellitus without complication ([facility])    Heart murmur    Hyperlipidemia    Hypertension    Skin cancer    Sleep apnea     Surgical History: Past Surgical History:  Procedure Laterality Date   APPENDECTOMY     CATARACT EXTRACTION     CHOLECYSTECTOMY     PARS PLANA VITRECTOMY W/ REPAIR OF MACULAR HOLE      Home Medications:  Allergies as of 01/04/2022   No Known Allergies      Medication List        Accurate as of January 04, 2022 11:21 AM. If you have any questions, ask your nurse or doctor.          amLODipine 10 MG tablet Commonly known as: NORVASC TAKE ONE TABLET BY MOUTH ONCE DAILY   calcitRIOL 0.25 MCG capsule Commonly  known as: ROCALTROL   dapagliflozin propanediol 10 MG Tabs tablet Commonly known as: FARXIGA Take 10 mg by mouth 1 day or 1 dose.   finasteride 5 MG tablet Commonly known as: Proscar Take 1 tablet (5 mg total) by mouth daily.   insulin glargine 100 UNIT/ML Solostar Pen Commonly known as: LANTUS INJECT 50 UNITS SUBCUTANEOUSLY ONCE DAILY   insulin lispro 100 UNIT/ML KiwkPen Commonly known as: HUMALOG INJECT 15 UNITS SUBCUTANEOUSLY THREE TIMES DAILY WITH MEALS   losartan 100 MG tablet Commonly known as: COZAAR Take 100 mg by mouth 1 day or 1 dose.   rosuvastatin 20 MG tablet Commonly known as: CRESTOR Take by mouth.   spironolactone 25 MG tablet Commonly known as: ALDACTONE Take 25 mg by mouth 1 day or 1 dose.   tamsulosin 0.4 MG Caps capsule Commonly known as: FLOMAX Take by mouth.   torsemide 10 MG tablet Commonly known as: DEMADEX Take 10 mg by mouth daily.        Allergies:  No Known Allergies  Family History: Family History  Problem Relation Age of Onset   Prostate cancer Neg Hx    Bladder Cancer Neg Hx    Kidney cancer Neg Hx     Social History:  reports that he has never smoked. He has never used smokeless tobacco. He reports that he does not drink alcohol and does not use drugs.   Physical Exam: BP 130/60   Pulse (!) 58   Ht 5' 10"  (1.778 m)   Wt 274 lb (124.3 kg)   BMI 39.31 kg/m   Constitutional:  Well nourished. Alert and oriented, No acute distress. HEENT: [place] AT, moist mucus membranes.  Trachea midline Cardiovascular: No clubbing, cyanosis, or edema. Respiratory: Normal respiratory effort, no increased work of breathing. Neurologic: Grossly intact, no focal deficits, moving all 4 extremities. Psychiatric: Normal mood and affect.   Laboratory Data: Glucose 70 - 110 mg/dL 131 High    Sodium 136 - 145 mmol/L 135 Low    Potassium 3.6 - 5.1 mmol/L 5.1   Chloride 97 - 109 mmol/L 106   Carbon Dioxide (CO2) 22.0 - 32.0 mmol/L 23.2   Calcium  8.7 - 10.3 mg/dL 9.5   Urea Nitrogen (BUN) 7 - 25 mg/dL 47 High    Creatinine 0.7 - 1.3 mg/dL 2.3 High    Glomerular Filtration Rate (eGFR), MDRD Estimate >60 mL/min/1.73sq m 28 Low    BUN/Crea Ratio 6.0 - 20.0 20.4 High    Anion Gap w/K  6.0 - 16.0 10.9   Resulting Agency  [facility] - LAB   Specimen Collected: 11/11/21 07:27   Performed by: [place]: 11/11/21 10:12  Received From: [facility] [place]  Result Received: 12/24/21 13:49   Hemoglobin A1C 4.2 - 5.6 % 8.6 High    Average Blood Glucose (Calc) mg/dL 200   Resulting [facility] - LAB  Narrative Performed by [facility] - LAB Normal Range:    4.2 - 5.6%  Increased Risk:  5.7 - 6.4%  Diabetes:        >= 6.5%  Glycemic Control for adults with diabetes:  <7%    Specimen Collected: 11/11/21 07:27   Performed by: [place]: 11/11/21 10:02  Received From: [facility]  Result Received: 12/24/21 13:49   Cholesterol, Total 100 - 200 mg/dL 92 Low    Triglyceride 35 - 199 mg/dL 169   HDL (High Density Lipoprotein) Cholesterol 29.0 - 71.0 mg/dL 23.2 Low    LDL Calculated 0 - 130 mg/dL 35   VLDL Cholesterol mg/dL 34   Cholesterol/HDL Ratio  4.0   Resulting Agency  [facility] - LAB   Specimen Collected: 11/11/21 07:27   Performed by: [place]: 11/11/21 10:13  Received From: [facility]  Result Received: 12/24/21 13:49   Urinalysis See Epic and HPI I have reviewed the labs.   Pertinent Imaging N/A    Assessment & Plan:    Elevated PSA  -reduced by 50% on finasteride -prostate MRI (2023) w/ PIRADS 3 lesions -PSA density 0.045  2. BPH with LUTS -PSA stable -prostate MRI (2022) - 123 cc prostate -UA benign -continue conservative management, avoiding bladder irritants and timed voiding's -Continue tamsulosin 0.4 mg daily and finasteride 5  mg daily   Return in about 6 months (around 07/05/2022) for PSA only .    [facility] [address] [facility], [place] [address] [phone]

## 2022-01-04 ENCOUNTER — Other Ambulatory Visit: Payer: Self-pay

## 2022-01-04 ENCOUNTER — Ambulatory Visit: Payer: Medicare Other | Admitting: Urology

## 2022-01-04 ENCOUNTER — Encounter: Payer: Self-pay | Admitting: Urology

## 2022-01-04 VITALS — BP 130/60 | HR 58 | Ht 70.0 in | Wt 274.0 lb

## 2022-01-04 DIAGNOSIS — R972 Elevated prostate specific antigen [PSA]: Secondary | ICD-10-CM

## 2022-01-04 DIAGNOSIS — N138 Other obstructive and reflux uropathy: Secondary | ICD-10-CM | POA: Diagnosis not present

## 2022-01-04 DIAGNOSIS — N401 Enlarged prostate with lower urinary tract symptoms: Secondary | ICD-10-CM

## 2022-01-04 LAB — URINALYSIS, COMPLETE
Bilirubin, UA: NEGATIVE
Ketones, UA: NEGATIVE
Leukocytes,UA: NEGATIVE
Nitrite, UA: NEGATIVE
Specific Gravity, UA: 1.015 (ref 1.005–1.030)
Urobilinogen, Ur: 0.2 mg/dL (ref 0.2–1.0)
pH, UA: 5 (ref 5.0–7.5)

## 2022-01-04 LAB — MICROSCOPIC EXAMINATION: Bacteria, UA: NONE SEEN

## 2022-04-30 ENCOUNTER — Other Ambulatory Visit: Payer: Self-pay | Admitting: Urology

## 2022-04-30 DIAGNOSIS — N138 Other obstructive and reflux uropathy: Secondary | ICD-10-CM

## 2022-07-05 ENCOUNTER — Other Ambulatory Visit: Payer: Medicare Other

## 2022-07-05 DIAGNOSIS — R972 Elevated prostate specific antigen [PSA]: Secondary | ICD-10-CM

## 2022-07-05 DIAGNOSIS — N138 Other obstructive and reflux uropathy: Secondary | ICD-10-CM

## 2022-07-06 ENCOUNTER — Telehealth: Payer: Self-pay | Admitting: *Deleted

## 2022-07-06 LAB — PSA: Prostate Specific Ag, Serum: 3.6 ng/mL (ref 0.0–4.0)

## 2022-07-06 NOTE — Telephone Encounter (Signed)
-----   Message from Nori Riis, PA-C sent at 07/06/2022  8:20 AM EST ----- Please ask Mr. Buhrman know that his PSA has increased a little bit.  Has he been taking the finasteride?

## 2022-07-06 NOTE — Telephone Encounter (Signed)
Notified patient as instructed, patientstates he has been taking his finasteride . No problems with medication .

## 2022-08-14 ENCOUNTER — Other Ambulatory Visit: Payer: Self-pay | Admitting: Urology

## 2022-08-14 DIAGNOSIS — N138 Other obstructive and reflux uropathy: Secondary | ICD-10-CM

## 2022-12-04 ENCOUNTER — Other Ambulatory Visit: Payer: Self-pay | Admitting: Urology

## 2022-12-04 DIAGNOSIS — N401 Enlarged prostate with lower urinary tract symptoms: Secondary | ICD-10-CM

## 2023-01-04 NOTE — Progress Notes (Unsigned)
01/05/23 1:44 PM   Alvin Thompson [birth year] [id number]  Referring provider:  Lauro Regulus, MD [address] [facility] [place] [address] [place],  [place] [address]  Urological history: Elevated PSA  -PSA pending -PSA (2022) 7.2 -Prostate MRI (2022) - Single PI-RADS category 3 lesion of the left anterior transition zone and single PI-RADS category 3 lesion of the right posterolateral peripheral zone. Targeting data sent to [facility].  Substantial Prostatomegaly with benign prostatic hypertrophy.  Hazy low T2 signal stranding throughout much of the peripheral zone, likely postinflammatory.  PSA density 0.045  2. BPH with LUTS  -prostate volume 123 cc on prostate MRI 03/2021 - tamsulosin 0.4 mg daily  3. High risk hematuria  - Non-smoker  - CTU 08/2019 showed 2.3 cm cyst in the upper pole right kidney. No urinary stones, decompressed ureters, and relatively poor opacification of the intrarenal collecting systems and ureters bilaterally.  -cysto 08/2019 Enlarged prostate w/ no median lobe.  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 Phimosis  5. UTI -occurrence 2019  6. ED -contributing factors of age, sleep apnea, HTN, diabetes, CKD and HLD -failed PDE5i's   Chief Complaint  Patient presents with   Elevated PSA    HPI: Alvin Thompson is a 77 y.o.male who presents today for 6 month follow up.   Previous records reviewed.   I PSS 6/1  He has no Urinary complaints.  Patient denies any modifying or aggravating factors.  Patient denies any recent UTI's, gross hematuria, dysuria or suprapubic/flank pain.  Patient denies any fevers, chills, nausea or vomiting.     IPSS     Row Name 01/05/23 1300         International Prostate Symptom Score   How  often have you had the sensation of not emptying your bladder? Not at All     How often have you had to urinate less than every two hours? Less than 1 in 5 times     How often have you found you stopped and started again several times when you urinated? Less than half the time     How often have you found it difficult to postpone urination? Less than 1 in 5 times     How often have you had a weak urinary stream? Not at All     How often have you had to strain to start urination? Not at All     How many times did you typically get up at night to urinate? 2 Times     Total IPSS Score 6       Quality of Life due to urinary symptoms   If you were to spend the rest of your life with your urinary condition just the way it is now how would you feel about that? Pleased                Score:  1-7 Mild 8-19 Moderate 20-35 Severe   PMH: Past Medical History:  Diagnosis Date   Arthritis    Diabetes mellitus without complication (HCC)    Heart murmur    Hyperlipidemia    Hypertension    Skin cancer    Sleep apnea     Surgical History: Past Surgical History:  Procedure Laterality Date   APPENDECTOMY     CATARACT EXTRACTION  CHOLECYSTECTOMY     PARS PLANA VITRECTOMY W/ REPAIR OF MACULAR HOLE      Home Medications:  Allergies as of 01/05/2023   No Known Allergies      Medication List        Accurate as of January 05, 2023  1:44 PM. If you have any questions, ask your nurse or doctor.          STOP taking these medications    spironolactone 25 MG tablet Commonly known as: ALDACTONE Stopped by: Adena Sima       TAKE these medications    amLODipine 10 MG tablet Commonly known as: NORVASC TAKE ONE TABLET BY MOUTH ONCE DAILY   calcitRIOL 0.25 MCG capsule Commonly known as: ROCALTROL   dapagliflozin propanediol 10 MG Tabs tablet Commonly known as: FARXIGA Take 10 mg by mouth 1 day or 1 dose.   finasteride 5 MG tablet Commonly known as:  PROSCAR Take 1 tablet by mouth once daily   insulin glargine 100 UNIT/ML Solostar Pen Commonly known as: LANTUS INJECT 50 UNITS SUBCUTANEOUSLY ONCE DAILY   insulin lispro 100 UNIT/ML KiwkPen Commonly known as: HUMALOG INJECT 15 UNITS SUBCUTANEOUSLY THREE TIMES DAILY WITH MEALS   losartan 100 MG tablet Commonly known as: COZAAR Take 100 mg by mouth 1 day or 1 dose.   rosuvastatin 20 MG tablet Commonly known as: CRESTOR Take by mouth.   tamsulosin 0.4 MG Caps capsule Commonly known as: FLOMAX Take by mouth.   torsemide 10 MG tablet Commonly known as: DEMADEX Take 10 mg by mouth daily.        Allergies:  No Known Allergies  Family History: Family History  Problem Relation Age of Onset   Prostate cancer Neg Hx    Bladder Cancer Neg Hx    Kidney cancer Neg Hx     Social History:  reports that he has never smoked. He has never used smokeless tobacco. He reports that he does not drink alcohol and does not use drugs.   Physical Exam: BP (!) 160/70   Pulse 66   Wt 276 lb (125.2 kg)   BMI 39.60 kg/m   GU: No CVA tenderness.  No bladder fullness or masses.  Patient with uncircumcised phallus. Foreskin not able to be retracted  Urethral meatus is patent.  No penile discharge. No penile lesions or rashes. Scrotum without lesions, cysts, rashes and/or edema.  Testicles are located scrotally bilaterally. No masses are appreciated in the testicles. Left and right epididymis are normal. Rectal: Patient with  normal sphincter tone. Anus and perineum without scarring or rashes. No rectal masses are appreciated. Prostate is approximately 60 + grams, could not palpate the entire gland, no nodules are appreciated. Seminal vesicles could not be palpated.    Laboratory Data: CBC and Differential Order: [id number] Component Ref Range & Units 3 wk ago  WBC 3.8 - 10.8 Thousand/uL 7.3  RBC 4.20 - 5.80 Million/uL 3.95 Low   Hemoglobin 13.2 - 17.1 g/dL 65.7 Low   Hematocrit 84.6 -  50.0 % 35.8 Low   MCV 80.0 - 100.0 fL 90.6  MCH 27.0 - 33.0 pg 30.9  MCHC 32.0 - 36.0 g/dL 96.2  RDW 95.2 - 84.1 % 12.0  Platelets 140 - 400 Thousand/uL 244  MPV 7.5 - 12.5 fL 9.4  Neutrophils Absolute 1500 - 7800 cells/uL 4,395  Band Neutrophils Absolute, Manual Count 0 - 750 cells/uL CANCELED  Comment: Result canceled by the ancillary.  Metamyelocytes Absolute 0 cells/uL CANCELED  Comment:  Result canceled by the ancillary.  Absolute Myelocytes 0 cells/uL CANCELED  Comment: Result canceled by the ancillary.  Absolute Promyelocytes 0 cells/uL CANCELED  Comment: Result canceled by the ancillary.  Lymphocytes Absolute 850 - 3900 cells/uL 2,081  Monocytes Absolute 200 - 950 cells/uL 562  Eosinophils Absolute 15 - 500 cells/uL 241  Basophils Absolute 0 - 200 cells/uL 22  Blasts Absolute 0 cells/uL CANCELED  Comment: Result canceled by the ancillary.  NRBC Absolute 0 cells/uL CANCELED  Comment: Result canceled by the ancillary.  Neutrophils Relative % 60.2  Bands Absolute % CANCELED  Comment: Result canceled by the ancillary.  Metamyelocytes Percent % CANCELED  Comment: Result canceled by the ancillary.  Myelocytes Relative % CANCELED  Comment: Result canceled by the ancillary.  Promyelocytes Relative % CANCELED  Comment: Result canceled by the ancillary.  Lymphocytes % 28.5  Variant lymphocytes/100 WBC (Bld) 0 - 10 % CANCELED  Comment: Result canceled by the ancillary.  Monocytes % 7.7  Eosinophils % 3.3  Basophils Relative % 0.3  Blasts % CANCELED  Comment: Result canceled by the ancillary.  nRBC 0 /100 WBC CANCELED  Comment: Result canceled by the ancillary.  Comment(s) CANCELED  Comment: Result canceled by the ancillary.  Resulting Agency See order comments   Specimen Collected: 12/13/22 09:15   Performed by: Chancy Hurter Last Resulted: 12/14/22 11:24  Received From: [facility]  Result Received: 01/04/23 09:26   Renal Function  Panel Order: [id number] Component Ref Range & Units 3 wk ago  Glucose 65 - 99 mg/dL 956 High   Comment:                                                                                             Fasting reference interval                                               For someone without known diabetes, a glucose      value >125 mg/dL indicates that they may have      diabetes and this should be confirmed with a      follow-up test.         BUN 7 - 25 mg/dL 44 High   Creatinine 2.13 - 1.28 mg/dL 0.86 High   eGFR CKD-EPI CR 2021 > OR = 60 mL/min/1.39m2 30 Low   BUN/Creatinine Ratio 6 - 22 (calc) 20  Sodium 135 - 146 mmol/L 136  Potassium 3.5 - 5.3 mmol/L 4.6  Chloride 98 - 110 mmol/L 105  Bicarbonate (CO2) 20 - 32 mmol/L 23  Calcium 8.6 - 10.3 mg/dL 9.4  Phosphorus 2.1 - 4.3 mg/dL 4.5 High   Albumin 3.6 - 5.1 g/dL 4.2  Resulting Agency See order comments   Specimen Collected: 12/13/22 09:15   Performed by: Chancy Hurter Last Resulted: 12/14/22 11:24  Received From: [facility]  Result Received: 01/04/23 09:26   Uric Acid Order: [id number] Component Ref Range & Units 3 wk ago  Uric Acid 4.0 - 8.0  mg/dL 7.1  Comment:      Therapeutic target for gout patients: <6.0 mg/dL         Resulting Agency See order comments   Specimen Collected: 12/13/22 09:15   Performed by: Chancy Hurter Last Resulted: 12/14/22 11:24  Received From: [facility]  Result Received: 01/04/23 09:26   Hemoglobin A1C Order: [id number] Component Ref Range & Units 1 mo ago  Hemoglobin A1C 4.2 - 5.6 % 7.4 High   Average Blood Glucose (Calc) mg/dL 623  Resulting Agency [facility]  Narrative Performed by [facility] Normal Range:    4.2 - 5.6% Increased Risk:  5.7 - 6.4% Diabetes:        >= 6.5% Glycemic Control for adults with diabetes:  <7%    Specimen Collected: 11/29/22 07:23   Performed by: [facility] Last Resulted: 11/29/22  08:26  Received From: [facility]  Result Received: 01/04/23 09:26   Hepatic Function Panel (HFP) Order: [id number] Component Ref Range & Units 1 mo ago  Protein, Total 6.1 - 7.9 g/dL 6.7  Albumin 3.5 - 4.8 g/dL 4.2  Bilirubin, Total 0.3 - 1.2 mg/dL 0.9  Bilirubin, Conjugated 0.00 - 0.20 mg/dL 6.16  Alk Phos (alkaline Phosphatase) 34 - 104 U/L 71  AST 8 - 39 U/L 13  ALT 6 - 57 U/L 9  Resulting Agency [facility]   Specimen Collected: 11/29/22 07:23   Performed by: [facility] Last Resulted: 11/29/22 11:41  Received From: [facility]  Result Received: 01/04/23 09:26   Lipid Panel w/calc LDL Order: [id number] Component Ref Range & Units 1 mo ago  Cholesterol, Total 100 - 200 mg/dL 948  Triglyceride 35 - 199 mg/dL 546  HDL (High Density Lipoprotein) Cholesterol 29.0 - 71.0 mg/dL 27.0 Low   LDL Calculated 0 - 130 mg/dL 41  VLDL Cholesterol mg/dL 35  Cholesterol/HDL Ratio 4.1  Resulting Agency [facility]   Specimen Collected: 11/29/22 07:23   Performed by: [facility] Last Resulted: 11/29/22 11:41  Received From: [facility]  Result Received: 01/04/23 09:26   Urinalysis Urinalysis, Complete w/reflex to Culture Order: [id number] Component Ref Range & Units 4 mo ago  Color, Urine YELLOW YELLOW  Appearance Urine CLEAR CLEAR  Specific Gravity, UA 1.001 - 1.035 1.023  pH Urine 5.0 - 8.0 < OR = 5.0  Glucose, Ur NEGATIVE 3+ Abnormal   Bilirubin, Urine NEGATIVE NEGATIVE  Ketones, Urine NEGATIVE NEGATIVE  Hemoglobin Ur Ql Strip NEGATIVE NEGATIVE  Protein, Ur NEGATIVE 2+ Abnormal   Nitrite, Urine NEGATIVE NEGATIVE  WBC Esterase Urine NEGATIVE NEGATIVE  WBC, Urine < OR = 5 /HPF NONE SEEN  RBC, Urine < OR = 2 /HPF NONE SEEN  Epithelial Cells in Urine < OR = 5 /HPF NONE SEEN  Trans Epithelial, Urine < OR = 5 /HPF CANCELED  Comment: Result  canceled by the ancillary.  Renal Epithelial Cells, Urine < OR = 3 /HPF CANCELED  Comment: Result canceled by the ancillary.  Bacteria NONE SEEN /HPF NONE SEEN  Calcium Oxalate Crystals, Urine NONE OR FEW /HPF CANCELED  Comment: Result canceled by the ancillary.  Triple Phosphate Crystals, Urine NONE OR FEW /HPF CANCELED  Comment: Result canceled by the ancillary.  Uric Acid Crystals, Urine NONE OR FEW /HPF CANCELED  Comment: Result canceled by the ancillary.  Amorphous Sediments NONE OR FEW /HPF CANCELED  Comment: Result canceled by the ancillary.  Crystals NONE SEEN /HPF CANCELED  Comment: Result canceled by the ancillary.  Hyaline Casts, Urine NONE SEEN /LPF NONE SEEN  Granular Casts, Urine NONE SEEN /LPF CANCELED  Comment: Result canceled by the ancillary.  Casts NONE SEEN /LPF CANCELED  Comment: Result canceled by the ancillary.  Yeast, UA NONE SEEN /HPF CANCELED  Comment: Result canceled by the ancillary.  Comments CANCELED  Comment: Result canceled by the ancillary.  Note: CANCELED  Comment: Result canceled by the ancillary.  Resulting Agency [facility]   Specimen Collected: 08/10/22 10:54   Performed by: Chancy Hurter Last Resulted: 08/11/22 13:13  Received From: [facility]  Result Received: 08/15/22 07:43  I have reviewed the labs.   Pertinent Imaging N/A    Assessment & Plan:    Elevated PSA  -PSA pending -continue finasteride  2. BPH with LUTS -prostate MRI (2022) - 123 cc prostate -continue conservative management, avoiding bladder irritants and timed voiding's -Continue tamsulosin 0.4 mg daily and finasteride 5 mg daily   Return in about 6 months (around 07/05/2023) for PSA, I PSS .   Cloretta Ned    [facility] [address], [address] [place], [place] [address] [phone]

## 2023-01-05 ENCOUNTER — Encounter: Payer: Self-pay | Admitting: Urology

## 2023-01-05 ENCOUNTER — Ambulatory Visit: Payer: Medicare Other | Admitting: Urology

## 2023-01-05 VITALS — BP 160/70 | HR 66 | Wt 276.0 lb

## 2023-01-05 DIAGNOSIS — N138 Other obstructive and reflux uropathy: Secondary | ICD-10-CM

## 2023-01-05 DIAGNOSIS — N401 Enlarged prostate with lower urinary tract symptoms: Secondary | ICD-10-CM

## 2023-01-05 DIAGNOSIS — R972 Elevated prostate specific antigen [PSA]: Secondary | ICD-10-CM | POA: Diagnosis not present

## 2023-01-06 ENCOUNTER — Ambulatory Visit: Payer: Medicare Other | Admitting: Urology

## 2023-01-06 LAB — PSA: Prostate Specific Ag, Serum: 3.7 ng/mL (ref 0.0–4.0)

## 2023-04-24 ENCOUNTER — Other Ambulatory Visit: Payer: Self-pay | Admitting: Urology

## 2023-04-24 DIAGNOSIS — N138 Other obstructive and reflux uropathy: Secondary | ICD-10-CM

## 2023-06-27 ENCOUNTER — Other Ambulatory Visit: Payer: Self-pay | Admitting: *Deleted

## 2023-06-27 DIAGNOSIS — R972 Elevated prostate specific antigen [PSA]: Secondary | ICD-10-CM

## 2023-06-28 ENCOUNTER — Other Ambulatory Visit: Payer: Medicare Other

## 2023-06-28 DIAGNOSIS — R972 Elevated prostate specific antigen [PSA]: Secondary | ICD-10-CM

## 2023-06-29 LAB — PSA: Prostate Specific Ag, Serum: 4.2 ng/mL — ABNORMAL HIGH (ref 0.0–4.0)

## 2023-07-04 NOTE — Progress Notes (Unsigned)
07/06/23 9:55 AM   Alvin Thompson December 09, 1945 [id number]  Referring provider:  Lauro Regulus, MD [address] [facility] [place] [facility] [place],  [place] [address]  Urological history: Elevated PSA  -PSA (06/2023) 4.2 -PSA (2022) 7.2 -Prostate MRI (2022) - Single PI-RADS category 3 lesion of the left anterior transition zone and single PI-RADS category 3 lesion of the right posterolateral peripheral zone. Targeting data sent to [facility].  Substantial Prostatomegaly with benign prostatic hypertrophy.  Hazy low T2 signal stranding throughout much of the peripheral zone, likely postinflammatory.  PSA density 0.045  2. BPH with LUTS  -prostate volume 123 cc on prostate MRI 03/2021 -tamsulosin 0.4 mg daily and finasteride 5 mg daily   3. High risk hematuria  - Non-smoker  - CTU 08/2019 showed 2.3 cm cyst in the upper pole right kidney. No urinary stones, decompressed ureters, and relatively poor opacification of the intrarenal collecting systems and ureters bilaterally.  -cysto 08/2019 Enlarged prostate w/ no median lobe.  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 Phimosis  5. UTI -occurrence 2019  6. ED -contributing factors of age, sleep apnea, HTN, diabetes, CKD and HLD -failed PDE5i's   Chief Complaint  Patient presents with   Elevated PSA    HPI: Alvin Thompson is a 78 y.o.male who presents today for 6 month follow up.   Previous records reviewed.   I PSS 7/1  He is taking the tamsulosin 0.4 mg and finasteride 5 mg daily.  He is having some ejaculatory disorders, but it is not bothersome to him.  Patient denies any modifying or aggravating factors.  Patient denies any recent UTI's, gross hematuria, dysuria or suprapubic/flank pain.   Patient denies any fevers, chills, nausea or vomiting.     IPSS     Row Name 07/06/23 0900         International Prostate Symptom Score   How often have you had the sensation of not emptying your bladder? Less than 1 in 5     How often have you had to urinate less than every two hours? Less than half the time     How often have you found you stopped and started again several times when you urinated? Not at All     How often have you found it difficult to postpone urination? Less than 1 in 5 times     How often have you had a weak urinary stream? Less than 1 in 5 times     How often have you had to strain to start urination? Not at All     How many times did you typically get up at night to urinate? 2 Times     Total IPSS Score 7       Quality of Life due to urinary symptoms   If you were to spend the rest of your life with your urinary condition just the way it is now how would you feel about that? Pleased              Score:  1-7 Mild 8-19 Moderate 20-35 Severe   PMH: Past Medical History:  Diagnosis Date   Arthritis    Diabetes mellitus without complication (HCC)    Heart murmur    Hyperlipidemia    Hypertension    Skin cancer  Sleep apnea     Surgical History: Past Surgical History:  Procedure Laterality Date   APPENDECTOMY     CATARACT EXTRACTION     CHOLECYSTECTOMY     PARS PLANA VITRECTOMY W/ REPAIR OF MACULAR HOLE      Home Medications:  Allergies as of 07/06/2023   No Known Allergies      Medication List        Accurate as of July 06, 2023  9:55 AM. If you have any questions, ask your nurse or doctor.          amLODipine 10 MG tablet Commonly known as: NORVASC TAKE ONE TABLET BY MOUTH ONCE DAILY   calcitRIOL 0.25 MCG capsule Commonly known as: ROCALTROL   dapagliflozin propanediol 10 MG Tabs tablet Commonly known as: FARXIGA Take 10 mg by mouth 1 day or 1 dose.   finasteride 5 MG tablet Commonly known as: PROSCAR Take 1 tablet  by mouth once daily   insulin glargine 100 UNIT/ML Solostar Pen Commonly known as: LANTUS INJECT 50 UNITS SUBCUTANEOUSLY ONCE DAILY   insulin lispro 100 UNIT/ML KiwkPen Commonly known as: HUMALOG INJECT 15 UNITS SUBCUTANEOUSLY THREE TIMES DAILY WITH MEALS   losartan 100 MG tablet Commonly known as: COZAAR Take 100 mg by mouth 1 day or 1 dose.   rosuvastatin 20 MG tablet Commonly known as: CRESTOR Take by mouth.   tamsulosin 0.4 MG Caps capsule Commonly known as: FLOMAX Take by mouth.   torsemide 10 MG tablet Commonly known as: DEMADEX Take 10 mg by mouth daily.        Allergies:  No Known Allergies  Family History: Family History  Problem Relation Age of Onset   Prostate cancer Neg Hx    Bladder Cancer Neg Hx    Kidney cancer Neg Hx     Social History:  reports that he has never smoked. He has never used smokeless tobacco. He reports that he does not drink alcohol and does not use drugs.   Physical Exam: BP (!) 156/67   Pulse 60   Ht 5\' 10"  (1.778 m)   Wt 269 lb (122 kg)   BMI 38.60 kg/m   Constitutional:  Well nourished. Alert and oriented, No acute distress. HEENT: [place] AT, moist mucus membranes.  Trachea midline Cardiovascular: No clubbing, cyanosis, or edema. Respiratory: Normal respiratory effort, no increased work of breathing. Neurologic: Grossly intact, no focal deficits, moving all 4 extremities. Psychiatric: Normal mood and affect.    Laboratory Data: Component     Latest Ref Rng 06/28/2023  Prostate Specific Ag, Serum     0.0 - 4.0 ng/mL 4.2 (H)     Legend: (H) High  Renal Function Panel Order: [id number] Component Ref Range & Units 2 wk ago  Glucose 65 - 99 mg/dL 409 High   Comment:                                                                                             Fasting reference interval  For someone without known diabetes, a glucose      value >125 mg/dL indicates that they  may have      diabetes and this should be confirmed with a      follow-up test.         BUN 7 - 25 mg/dL 30 High   Creatinine 1.61 - 1.28 mg/dL 0.96 High   eGFR CKD-EPI CR 2021 > OR = 60 mL/min/1.32m2 33 Low   BUN/Creatinine Ratio 6 - 22 (calc) 15  Sodium 135 - 146 mmol/L 138  Potassium 3.5 - 5.3 mmol/L 4.7  Chloride 98 - 110 mmol/L 106  Bicarbonate (CO2) 20 - 32 mmol/L 25  Calcium 8.6 - 10.3 mg/dL 9  Phosphorus 2.1 - 4.3 mg/dL 3.2  Albumin 3.6 - 5.1 g/dL 4.2  Resulting Agency See order comments   Specimen Collected: 06/19/23 09:43   Performed by: Chancy Hurter Last Resulted: 06/20/23 11:25  Received From: [facility]  Result Received: 06/27/23 08:41   CBC and Differential Order: [id number] Component Ref Range & Units 2 wk ago  WBC 3.8 - 10.8 Thousand/uL 7.5  RBC 4.20 - 5.80 Million/uL 4.39  Hemoglobin 13.2 - 17.1 g/dL 91.4  Hematocrit 78.2 - 50.0 % 40.5  MCV 80.0 - 100.0 fL 92.3  MCH 27.0 - 33.0 pg 30.5  MCHC 32.0 - 36.0 g/dL 95.6  Comment:      For adults, a slight decrease in the calculated MCHC      value (in the range of 30 to 32 g/dL) is most likely      not clinically significant; however, it should be      interpreted with caution in correlation with other      red cell parameters and the patient's clinical      condition.  RDW 11.0 - 15.0 % 12.8  Platelets 140 - 400 Thousand/uL 247  MPV 7.5 - 12.5 fL 9.6  Neutrophils Absolute 1500 - 7800 cells/uL 4,770  Band Neutrophils Absolute, Manual Count 0 - 750 cells/uL CANCELED  Comment: Result canceled by the ancillary.  Metamyelocytes Absolute 0 cells/uL CANCELED  Comment: Result canceled by the ancillary.  Absolute Myelocytes 0 cells/uL CANCELED  Comment: Result canceled by the ancillary.  Absolute Promyelocytes 0 cells/uL CANCELED  Comment: Result canceled by the ancillary.  Lymphocytes Absolute 850 - 3900 cells/uL 1,950  Monocytes Absolute 200 - 950 cells/uL 450  Eosinophils  Absolute 15 - 500 cells/uL 300  Basophils Absolute 0 - 200 cells/uL 30  Blasts Absolute 0 cells/uL CANCELED  Comment: Result canceled by the ancillary.  NRBC Absolute 0 cells/uL CANCELED  Comment: Result canceled by the ancillary.  Neutrophils Relative % 63.6  Bands Absolute % CANCELED  Comment: Result canceled by the ancillary.  Metamyelocytes Percent % CANCELED  Comment: Result canceled by the ancillary.  Myelocytes Relative % CANCELED  Comment: Result canceled by the ancillary.  Promyelocytes Relative % CANCELED  Comment: Result canceled by the ancillary.  Lymphocytes % 26  Variant lymphocytes/100 WBC (Bld) 0 - 10 % CANCELED  Comment: Result canceled by the ancillary.  Monocytes % 6  Eosinophils % 4  Basophils Relative % 0.4  Blasts % CANCELED  Comment: Result canceled by the ancillary.  nRBC 0 /100 WBC CANCELED  Comment: Result canceled by the ancillary.  Comment(s) CANCELED  Comment: Result canceled by the ancillary.  Resulting Agency See order comments   Specimen Collected: 06/19/23 09:43   Performed by: Chancy Hurter Last Resulted: 06/20/23 11:25  Received From: [facility]  Result Received: 06/27/23 08:41   Urinalysis, Complete w/reflex to Culture Order: [id number] Component Ref Range & Units 2 wk ago  Color, Urine YELLOW YELLOW  Appearance Urine CLEAR CLEAR  Specific Gravity, UA 1.001 - 1.035 1.001  pH Urine 5.0 - 8.0 5.5  Glucose, Ur NEGATIVE 1+ Abnormal   Bilirubin, Urine NEGATIVE NEGATIVE  Ketones, Urine NEGATIVE NEGATIVE  Hemoglobin Ur Ql Strip NEGATIVE NEGATIVE  Protein, Ur NEGATIVE NEGATIVE  Nitrite, Urine NEGATIVE NEGATIVE  WBC Esterase Urine NEGATIVE NEGATIVE  WBC, Urine < OR = 5 /HPF 0-5  RBC, Urine < OR = 2 /HPF NONE SEEN  Epithelial Cells in Urine < OR = 5 /HPF NONE SEEN  Trans Epithelial, Urine < OR = 5 /HPF CANCELED  Comment: Result canceled by the ancillary.  Renal Epithelial Cells, Urine < OR = 3 /HPF  CANCELED  Comment: Result canceled by the ancillary.  Bacteria NONE SEEN /HPF NONE SEEN  Calcium Oxalate Crystals, Urine NONE OR FEW /HPF CANCELED  Comment: Result canceled by the ancillary.  Triple Phosphate Crystals, Urine NONE OR FEW /HPF CANCELED  Comment: Result canceled by the ancillary.  Uric Acid Crystals, Urine NONE OR FEW /HPF CANCELED  Comment: Result canceled by the ancillary.  Amorphous Sediments NONE OR FEW /HPF CANCELED  Comment: Result canceled by the ancillary.  Crystals NONE SEEN /HPF CANCELED  Comment: Result canceled by the ancillary.  Hyaline Casts, Urine NONE SEEN /LPF NONE SEEN  Granular Casts, Urine NONE SEEN /LPF CANCELED  Comment: Result canceled by the ancillary.  Casts NONE SEEN /LPF CANCELED  Comment: Result canceled by the ancillary.  Yeast, UA NONE SEEN /HPF CANCELED  Comment: Result canceled by the ancillary.  Comments CANCELED  Comment: Result canceled by the ancillary.  Note: CANCELED  Comment: Result canceled by the ancillary.  Resulting Agency See order comments   Specimen Collected: 06/19/23 09:43   Performed by: Chancy Hurter Last Resulted: 06/20/23 11:25  Received From: [facility]  Result Received: 06/27/23 08:41   Hemoglobin A1C Order: [id number] Component Ref Range & Units 2 mo ago  Hemoglobin A1C 4.2 - 5.6 % 7.3 High   Average Blood Glucose (Calc) mg/dL 782  Resulting Agency [facility]  Narrative Performed by [facility] Normal Range:    4.2 - 5.6% Increased Risk:  5.7 - 6.4% Diabetes:        >= 6.5% Glycemic Control for adults with diabetes:  <7%    Specimen Collected: 04/06/23 07:59   Performed by: [facility] Last Resulted: 04/06/23 09:11  Received From: [facility]  Result Received: 04/27/23 07:42  I have reviewed the labs.   Pertinent Imaging N/A    Assessment & Plan:    Elevated PSA  -PSA elevated  -continue finasteride -his PSAD is  0.068 which is reassuring that he does not have clinically significant prostate cancer -discussed that because of his age and co morbidities he may chose to discontinue screening and he would like to continue to monitor his PSA at this time -PSA in 6 months   2. BPH with LUTS -prostate MRI (2022) - 123 cc prostate -continue conservative management, avoiding bladder irritants and timed voiding's -Continue tamsulosin 0.4 mg daily and finasteride 5 mg daily   Return in about 6 months (around 01/06/2024) for PSA only .   Michiel Cowboy, PA-C    [facility] [address] [place], [place] [address] [phone]  [phone]  

## 2023-07-06 ENCOUNTER — Ambulatory Visit: Payer: Medicare Other | Admitting: Urology

## 2023-07-06 ENCOUNTER — Encounter: Payer: Self-pay | Admitting: Urology

## 2023-07-06 VITALS — BP 156/67 | HR 60 | Ht 70.0 in | Wt 269.0 lb

## 2023-07-06 DIAGNOSIS — R972 Elevated prostate specific antigen [PSA]: Secondary | ICD-10-CM

## 2023-07-06 DIAGNOSIS — N138 Other obstructive and reflux uropathy: Secondary | ICD-10-CM

## 2023-07-06 DIAGNOSIS — N401 Enlarged prostate with lower urinary tract symptoms: Secondary | ICD-10-CM

## 2023-07-18 ENCOUNTER — Other Ambulatory Visit: Payer: Medicare Other

## 2024-01-03 ENCOUNTER — Other Ambulatory Visit

## 2024-01-04 ENCOUNTER — Other Ambulatory Visit

## 2024-01-04 DIAGNOSIS — R972 Elevated prostate specific antigen [PSA]: Secondary | ICD-10-CM

## 2024-01-04 NOTE — Progress Notes (Signed)
 01/07/24 7:31 AM   Alvin Thompson March 28, 1946 [id number]  Referring provider:  Lenon Layman ORN, MD [address] [facility] [place] [facility] [place],  [place] [address]  Urological history: Elevated PSA  -PSA (2022) 7.2 -Prostate MRI (2022) - Single PI-RADS category 3 lesion of the left anterior transition zone and single PI-RADS category 3 lesion of the right posterolateral peripheral zone. Targeting data sent to [facility]roNAV.  Substantial Prostatomegaly with benign prostatic hypertrophy.  Hazy low T2 signal stranding throughout much of the peripheral zone, likely postinflammatory.  PSA density 0.045  2. BPH with LUTS  - PSA (12/2023) 4.0 - corrected 8.0  -prostate volume 123 cc on prostate MRI 03/2021 -tamsulosin 0.4 mg daily and finasteride  5 mg daily   3. High risk hematuria  - Non-smoker  - CTU 08/2019 showed 2.3 cm cyst in the upper pole right kidney. No urinary stones, decompressed ureters, and relatively poor opacification of the intrarenal collecting systems and ureters bilaterally.  -cysto 08/2019 Enlarged prostate w/ no median lobe.  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 Phimosis  5. UTI -occurrence 2019  6. ED -contributing factors of age, sleep apnea, HTN, diabetes, CKD and HLD -failed PDE5i's   Chief Complaint  Patient presents with   Elevated PSA    HPI: Alvin Thompson is a 78 y.o.male who presents today for 6 month follow up.   Previous records reviewed.   He feels he is doing well today.  He has no urinary complaints.   Patient denies any modifying or aggravating factors.  Patient denies any recent UTI's, gross hematuria, dysuria or suprapubic/flank pain.  Patient denies any fevers, chills, nausea or vomiting.    He is taking  the finasteride  5 mg daily and tamsulosin 0.4 mg daily.    His PSA was 4.0, corrected 8.0.  Which is down from 4.2 (8.4) six months ago.   PMH: Past Medical History:  Diagnosis Date   Arthritis    Diabetes mellitus without complication (HCC)    Heart murmur    Hyperlipidemia    Hypertension    Skin cancer    Sleep apnea     Surgical History: Past Surgical History:  Procedure Laterality Date   APPENDECTOMY     CATARACT EXTRACTION     CHOLECYSTECTOMY     PARS PLANA VITRECTOMY W/ REPAIR OF MACULAR HOLE      Home Medications:  Allergies as of 01/05/2024   No Known Allergies      Medication List        Accurate as of January 05, 2024 11:59 PM. If you have any questions, ask your nurse or doctor.          amLODipine 10 MG tablet Commonly known as: NORVASC TAKE ONE TABLET BY MOUTH ONCE DAILY   calcitRIOL 0.25 MCG capsule Commonly known as: ROCALTROL   dapagliflozin propanediol 10 MG Tabs tablet Commonly known as: FARXIGA Take 10 mg by mouth 1 day or 1 dose.   finasteride  5 MG tablet Commonly known as: PROSCAR  Take 1 tablet by mouth once daily   insulin glargine 100 UNIT/ML Solostar Pen Commonly known as: LANTUS INJECT 50 UNITS SUBCUTANEOUSLY ONCE DAILY What changed: See the new instructions.   insulin lispro 100 UNIT/ML KiwkPen Commonly known as: HUMALOG INJECT 15 UNITS SUBCUTANEOUSLY THREE TIMES DAILY WITH MEALS  losartan 100 MG tablet Commonly known as: COZAAR Take 100 mg by mouth 1 day or 1 dose.   rosuvastatin 20 MG tablet Commonly known as: CRESTOR Take by mouth.   spironolactone 25 MG tablet Commonly known as: ALDACTONE Take 25 mg by mouth daily.   tamsulosin 0.4 MG Caps capsule Commonly known as: FLOMAX Take by mouth.   torsemide 10 MG tablet Commonly known as: DEMADEX Take 10 mg by mouth daily.        Allergies:  No Known Allergies  Family History: Family History  Problem Relation Age of Onset   Prostate cancer Neg Hx     Bladder Cancer Neg Hx    Kidney cancer Neg Hx     Social History:  reports that he has never smoked. He has never used smokeless tobacco. He reports that he does not drink alcohol and does not use drugs.   Physical Exam: BP (!) 138/49 (BP Location: Right Arm, Patient Position: Sitting, Cuff Size: Large)   Pulse 74   Wt 256 lb (116.1 kg)   SpO2 98%   BMI 36.73 kg/m   Constitutional:  Well nourished. Alert and oriented, No acute distress. HEENT: [place] AT, moist mucus membranes.  Trachea midline Cardiovascular: No clubbing, cyanosis, or edema. Respiratory: Normal respiratory effort, no increased work of breathing. Neurologic: Grossly intact, no focal deficits, moving all 4 extremities. Psychiatric: Normal mood and affect.    Laboratory Data: Basic Metabolic Panel (BMP) Order: [id number] Component Ref Range & Units 3 wk ago  Glucose 70 - 110 mg/dL 874 High   Sodium 863 - 145 mmol/L 136  Potassium 3.6 - 5.1 mmol/L 5.3 High   Chloride 97 - 109 mmol/L 108  Carbon Dioxide (CO2) 22.0 - 32.0 mmol/L 20.6 Low   Calcium 8.7 - 10.3 mg/dL 9.5  Urea Nitrogen (BUN) 7 - 25 mg/dL 51 High   Creatinine 0.7 - 1.3 mg/dL 2.2 High   Glomerular Filtration Rate (eGFR) >60 mL/min/1.73sq m 30 Low   Comment: CKD-EPI (2021) does not include patient's race in the calculation of eGFR.  Monitoring changes of plasma creatinine and eGFR over time is useful for monitoring kidney function.  Interpretive Ranges for eGFR (CKD-EPI 2021):  eGFR:       >60 mL/min/1.73 sq. m - Normal eGFR:       30-59 mL/min/1.73 sq. m - Moderately Decreased eGFR:       15-29 mL/min/1.73 sq. m  - Severely Decreased eGFR:       < 15 mL/min/1.73 sq. m  - Kidney Failure   Note: These eGFR calculations do not apply in acute situations when eGFR is changing rapidly or patients on dialysis.  BUN/Crea Ratio 6.0 - 20.0 23.2 High   Anion Gap w/K 6.0 - 16.0 12.7  Resulting Agency [facility]   Specimen  Collected: 12/11/23 08:07   Performed by: [facility] Last Resulted: 12/11/23 11:12  Received From: [facility]  Result Received: 01/03/24 16:11   Hemoglobin A1C Order: [id number] Component Ref Range & Units 3 wk ago  Hemoglobin A1C 4.2 - 5.6 % 7.5 High   Average Blood Glucose (Calc) mg/dL 830  Resulting Agency [facility]  Narrative Performed by [facility] Normal Range:    4.2 - 5.6% Increased Risk:  5.7 - 6.4% Diabetes:        >= 6.5% Glycemic Control for adults with diabetes:  <7%    Specimen Collected:  12/11/23 08:07   Performed by: [facility] Last Resulted: 12/11/23 09:09  Received From: [facility]  Result Received: 01/03/24 16:11   Lipid Panel w/calc LDL Order: [id number] Component Ref Range & Units 3 wk ago  Cholesterol, Total 100 - 200 mg/dL 899  Triglyceride 35 - 199 mg/dL 828  HDL (High Density Lipoprotein) Cholesterol 29.0 - 71.0 mg/dL 76.9 Low   LDL Calculated 0 - 130 mg/dL 43  VLDL Cholesterol mg/dL 34  Cholesterol/HDL Ratio 4.3  Resulting Agency [facility]   Specimen Collected: 12/11/23 08:07   Performed by: [facility] Last Resulted: 12/11/23 11:12  Received From: [facility]  Result Received: 01/03/24 16:11   Urinalysis, Complete w/reflex to Culture Order: [id number] Component Ref Range & Units 2 mo ago  Color, Urine YELLOW YELLOW  Appearance Urine CLEAR CLEAR  Specific Gravity, UA 1.001 - 1.035 1.024  pH Urine 5.0 - 8.0 < OR = 5.0 Abnormal   Glucose, Ur NEGATIVE 3+ Abnormal   Bilirubin, Urine NEGATIVE NEGATIVE  Ketones, Urine NEGATIVE NEGATIVE  Hemoglobin Ur Ql Strip NEGATIVE NEGATIVE  Protein, Ur NEGATIVE 2+ Abnormal   Nitrite, Urine NEGATIVE NEGATIVE  WBC Esterase Urine NEGATIVE NEGATIVE  WBC, Urine < OR = 5 /HPF NONE SEEN  RBC, Urine < OR = 2 /HPF NONE SEEN  Epithelial Cells in  Urine < OR = 5 /HPF NONE SEEN  Trans Epithelial, Urine < OR = 5 /HPF CANCELED  Comment: Result canceled by the ancillary.  Renal Epithelial Cells, Urine < OR = 3 /HPF CANCELED  Comment: Result canceled by the ancillary.  Bacteria NONE SEEN /HPF NONE SEEN  Calcium Oxalate Crystals, Urine NONE OR FEW /HPF CANCELED  Comment: Result canceled by the ancillary.  Triple Phosphate Crystals, Urine NONE OR FEW /HPF CANCELED  Comment: Result canceled by the ancillary.  Uric Acid Crystals, Urine NONE OR FEW /HPF CANCELED  Comment: Result canceled by the ancillary.  Amorphous Sediments NONE OR FEW /HPF CANCELED  Comment: Result canceled by the ancillary.  Crystals NONE SEEN /HPF CANCELED  Comment: Result canceled by the ancillary.  Hyaline Casts, Urine NONE SEEN /LPF NONE SEEN  Granular Casts, Urine NONE SEEN /LPF CANCELED  Comment: Result canceled by the ancillary.  Casts NONE SEEN /LPF CANCELED  Comment: Result canceled by the ancillary.  Yeast, UA NONE SEEN /HPF CANCELED  Comment: Result canceled by the ancillary.  Note:   Comment:      This urine was analyzed for the presence of WBC,      RBC, bacteria, casts, and other formed elements.      Only those elements seen were reported.                Resulting Agency [facility]  Narrative Performed by ORLIN SPIEGEL SPLIT 10/25/2023 FROM [id number]  Specimen Collected: 10/25/23 10:50   Performed by: ORLIN SPIEGEL Last Resulted: 10/26/23 06:48  Received From: [facility]  Result Received: 01/03/24 16:10  I have reviewed the labs.   Pertinent Imaging N/A    Assessment & Plan:    Elevated PSA  -PSA elevated, but is slightly reduced from 6 months ago  -continue finasteride   -his PSAD is 0.068 which places him at a lower risk for having clinically significant prostate cancer  -discussed that because of his age and co morbidities he may chose to discontinue screening and he would like to continue to  monitor his PSA at this  time  -PSA in 6 months   2. BPH with LUTS -prostate MRI (2022) - 123 cc prostate -continue conservative management, avoiding bladder irritants and timed voiding's -Continue tamsulosin 0.4 mg daily and finasteride  5 mg daily   Return in about 6 months (around 07/04/2024) for PSA only .  And then in one year for PSA/medication management   CLOTILDA CORNWALL, PA-C    [facility] [address] [place], [place] [address] [phone]

## 2024-01-05 ENCOUNTER — Ambulatory Visit: Admitting: Urology

## 2024-01-05 VITALS — BP 138/49 | HR 74 | Wt 256.0 lb

## 2024-01-05 DIAGNOSIS — N401 Enlarged prostate with lower urinary tract symptoms: Secondary | ICD-10-CM

## 2024-01-05 DIAGNOSIS — R972 Elevated prostate specific antigen [PSA]: Secondary | ICD-10-CM | POA: Diagnosis not present

## 2024-01-05 DIAGNOSIS — N138 Other obstructive and reflux uropathy: Secondary | ICD-10-CM

## 2024-01-05 LAB — PSA: Prostate Specific Ag, Serum: 4 ng/mL (ref 0.0–4.0)

## 2024-01-07 ENCOUNTER — Encounter: Payer: Self-pay | Admitting: Urology

## 2024-04-11 ENCOUNTER — Other Ambulatory Visit: Payer: Self-pay | Admitting: Urology

## 2024-04-11 DIAGNOSIS — N138 Other obstructive and reflux uropathy: Secondary | ICD-10-CM

## 2024-07-02 ENCOUNTER — Other Ambulatory Visit

## 2024-07-04 ENCOUNTER — Ambulatory Visit: Admitting: Urology
